# Patient Record
Sex: Female | Born: 1970 | Race: White | Hispanic: No | Marital: Married | Smoking: Former smoker
Health system: Southern US, Community
[De-identification: ages and names within clinical notes are randomized; demographics above are authoritative.]

## PROBLEM LIST (undated history)

## (undated) DIAGNOSIS — F329 Major depressive disorder, single episode, unspecified: Secondary | ICD-10-CM

## (undated) DIAGNOSIS — R011 Cardiac murmur, unspecified: Secondary | ICD-10-CM

## (undated) DIAGNOSIS — E039 Hypothyroidism, unspecified: Secondary | ICD-10-CM

## (undated) DIAGNOSIS — R87619 Unspecified abnormal cytological findings in specimens from cervix uteri: Secondary | ICD-10-CM

## (undated) DIAGNOSIS — F419 Anxiety disorder, unspecified: Secondary | ICD-10-CM

## (undated) DIAGNOSIS — F32A Depression, unspecified: Secondary | ICD-10-CM

## (undated) DIAGNOSIS — I10 Essential (primary) hypertension: Secondary | ICD-10-CM

## (undated) DIAGNOSIS — E669 Obesity, unspecified: Secondary | ICD-10-CM

## (undated) HISTORY — PX: DILATION AND CURETTAGE OF UTERUS: SHX78

## (undated) HISTORY — DX: Anxiety disorder, unspecified: F41.9

## (undated) HISTORY — DX: Hypothyroidism, unspecified: E03.9

## (undated) HISTORY — DX: Unspecified abnormal cytological findings in specimens from cervix uteri: R87.619

## (undated) HISTORY — DX: Major depressive disorder, single episode, unspecified: F32.9

## (undated) HISTORY — DX: Obesity, unspecified: E66.9

## (undated) HISTORY — DX: Essential (primary) hypertension: I10

## (undated) HISTORY — DX: Depression, unspecified: F32.A

## (undated) HISTORY — DX: Cardiac murmur, unspecified: R01.1

## (undated) HISTORY — PX: SALIVARY GLAND SURGERY: SHX768

## (undated) HISTORY — PX: COLPOSCOPY: SHX161

## (undated) HISTORY — PX: ENDOMETRIAL ABLATION: SHX621

---

## 1996-08-01 HISTORY — PX: TYMPANOSTOMY: SHX2586

## 2004-07-01 ENCOUNTER — Ambulatory Visit (HOSPITAL_COMMUNITY): Admission: RE | Admit: 2004-07-01 | Discharge: 2004-07-01 | Payer: Self-pay | Admitting: Obstetrics and Gynecology

## 2004-08-13 ENCOUNTER — Ambulatory Visit (HOSPITAL_COMMUNITY): Admission: RE | Admit: 2004-08-13 | Discharge: 2004-08-13 | Payer: Self-pay | Admitting: Obstetrics and Gynecology

## 2010-04-06 ENCOUNTER — Ambulatory Visit: Payer: Self-pay | Admitting: Emergency Medicine
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
## 2013-10-17 ENCOUNTER — Emergency Department (INDEPENDENT_AMBULATORY_CARE_PROVIDER_SITE_OTHER): Payer: BC Managed Care – PPO

## 2013-10-17 ENCOUNTER — Emergency Department
Admission: EM | Admit: 2013-10-17 | Discharge: 2013-10-17 | Disposition: A | Discharge status: Home / Self Care | Payer: BC Managed Care – PPO | Source: Home / Self Care | Attending: Emergency Medicine | Admitting: Emergency Medicine

## 2013-10-17 ENCOUNTER — Encounter: Payer: Self-pay | Admitting: Emergency Medicine

## 2013-10-17 DIAGNOSIS — S0083XA Contusion of other part of head, initial encounter: Secondary | ICD-10-CM

## 2013-10-17 DIAGNOSIS — S60219A Contusion of unspecified wrist, initial encounter: Secondary | ICD-10-CM

## 2013-10-17 DIAGNOSIS — S161XXA Strain of muscle, fascia and tendon at neck level, initial encounter: Secondary | ICD-10-CM

## 2013-10-17 DIAGNOSIS — S0003XA Contusion of scalp, initial encounter: Secondary | ICD-10-CM

## 2013-10-17 DIAGNOSIS — S139XXA Sprain of joints and ligaments of unspecified parts of neck, initial encounter: Secondary | ICD-10-CM

## 2013-10-17 DIAGNOSIS — S60212A Contusion of left wrist, initial encounter: Secondary | ICD-10-CM

## 2013-10-17 DIAGNOSIS — S1093XA Contusion of unspecified part of neck, initial encounter: Secondary | ICD-10-CM

## 2013-10-17 DIAGNOSIS — M25539 Pain in unspecified wrist: Secondary | ICD-10-CM

## 2013-10-17 DIAGNOSIS — R6884 Jaw pain: Secondary | ICD-10-CM

## 2013-10-17 MED ORDER — CYCLOBENZAPRINE HCL 5 MG PO TABS: ORAL_TABLET | ORAL | Status: DC

## 2013-10-17 MED ORDER — HYDROCODONE-ACETAMINOPHEN 5-325 MG PO TABS: 1.0000 | ORAL_TABLET | ORAL | Status: DC | PRN
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
## 2014-02-14 LAB — HM MAMMOGRAPHY

## 2014-02-26 ENCOUNTER — Encounter: Payer: Self-pay | Admitting: Physician Assistant

## 2014-02-26 ENCOUNTER — Ambulatory Visit (INDEPENDENT_AMBULATORY_CARE_PROVIDER_SITE_OTHER): Payer: BC Managed Care – PPO | Admitting: Physician Assistant

## 2014-02-26 ENCOUNTER — Other Ambulatory Visit: Payer: Self-pay | Admitting: *Deleted

## 2014-02-26 VITALS — BP 136/84 | HR 69 | Ht 62.0 in | Wt 193.0 lb

## 2014-02-26 DIAGNOSIS — E669 Obesity, unspecified: Secondary | ICD-10-CM

## 2014-02-26 DIAGNOSIS — E039 Hypothyroidism, unspecified: Secondary | ICD-10-CM

## 2014-02-26 DIAGNOSIS — N840 Polyp of corpus uteri: Secondary | ICD-10-CM | POA: Insufficient documentation

## 2014-02-26 DIAGNOSIS — N949 Unspecified condition associated with female genital organs and menstrual cycle: Secondary | ICD-10-CM

## 2014-02-26 DIAGNOSIS — N925 Other specified irregular menstruation: Secondary | ICD-10-CM | POA: Diagnosis not present

## 2014-02-26 DIAGNOSIS — Z13220 Encounter for screening for lipoid disorders: Secondary | ICD-10-CM

## 2014-02-26 DIAGNOSIS — N938 Other specified abnormal uterine and vaginal bleeding: Secondary | ICD-10-CM | POA: Insufficient documentation

## 2014-02-26 DIAGNOSIS — M76899 Other specified enthesopathies of unspecified lower limb, excluding foot: Secondary | ICD-10-CM

## 2014-02-26 DIAGNOSIS — M7061 Trochanteric bursitis, right hip: Secondary | ICD-10-CM | POA: Insufficient documentation

## 2014-02-26 DIAGNOSIS — Z131 Encounter for screening for diabetes mellitus: Secondary | ICD-10-CM

## 2014-02-26 HISTORY — DX: Hypothyroidism, unspecified: E03.9

## 2014-02-26 NOTE — Progress Notes (Signed)
   Subjective:    Patient ID: Julie Martin, female    DOB: 1970/09/08, 43 y.o.   MRN: [id number]  HPI Patient is a 43 year old female who presents to the clinic to establish care.  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 She's previously been managed by her primary care physician. Her last injection in her bursa was 1 year ago.  Patient recently went to OB/GYN for her annual appointment. She does have an up-to-date Pap smear of 2014. There was identified uterine polyps from dysfunctional uterine bleeding. She has appointment in August for ablation and D&C.  Patient has had a past history of underactive thyroid. She has not been on medication since the birth of her son 9 years ago. She certainly struggles with being obese and she works out 3-4 times a week along with counts her calories to about 1300 calories a day. She really would like to work on weight loss.  .. History   Social History  . Marital Status: Married    Spouse Name: N/A    Number of Children: N/A  . Years of Education: N/A   Occupational History  . Not on file.   Social History Main Topics  . Smoking status: Former Games developermoker  . Smokeless tobacco: Not on file  . Alcohol Use: Yes  . Drug Use: Not on file  . Sexual Activity: Not on file   Other Topics Concern  . Not on file   Social History Narrative  . No narrative on file       Review of Systems  All other systems reviewed and are negative.      Objective:   Physical Exam  Constitutional: She is oriented to person, place, and time. She appears well-developed and well-nourished.  HENT:  Head: Normocephalic and atraumatic.  Neck: Normal range of motion. Neck supple.  Cardiovascular: Normal rate, regular rhythm and normal heart sounds.   Pulmonary/Chest: Effort normal and breath sounds normal.  shoulder notching present bilaterally.   Lymphadenopathy:    She has no cervical adenopathy.  Neurological: She is  alert and oriented to person, place, and time.  Skin: Skin is dry.  Psychiatric: She has a normal mood and affect. Her behavior is normal.          Assessment & Plan:  Hypothyroidism-she has not had thyroid levels checked since birth of her child 9 years ago. Certainly with her weight struggle we will check today.   Greater trochanteric bursitis- discussed with pt exercises to help and possible PT. Pt doing well today. Last injection over one year.   Uterine polyp/DUB- managed by GYN. Surgery scheduled for 03/2014.  Bilateral large breast- discuss with patient that large breasts could be impeding on her exercise and ability to lose weight. Patient also complains of upper back tightness and pain from breath for the last couple of years. She really would like to lose weight on her own and see if her breast size decreases.  Obesity- certainly sounds like she is keeping to a good caloric intake a day and exercise regularly. Discussed stepping up exercise to more intensity and try interval training. Offered to start phentermine. Declined today. Will get in with nutritionist.   Fasting labs were given to have drawn. Follow up with [facility].

## 2014-02-26 NOTE — Patient Instructions (Signed)
Nutritionist

## 2014-03-17 ENCOUNTER — Encounter: Payer: BC Managed Care – PPO | Admitting: Physician Assistant

## 2014-03-20 LAB — COMPLETE METABOLIC PANEL WITH GFR
ALT: 9 U/L (ref 0–35)
AST: 12 U/L (ref 0–37)
Albumin: 4.2 g/dL (ref 3.5–5.2)
Alkaline Phosphatase: 65 U/L (ref 39–117)
BILIRUBIN TOTAL: 0.4 mg/dL (ref 0.2–1.2)
BUN: 13 mg/dL (ref 6–23)
CO2: 25 mEq/L (ref 19–32)
Calcium: 8.8 mg/dL (ref 8.4–10.5)
Chloride: 106 mEq/L (ref 96–112)
Creat: 0.68 mg/dL (ref 0.50–1.10)
GFR, Est African American: >89 mL/min
GLUCOSE: 85 mg/dL (ref 70–99)
Potassium: 4.4 mEq/L (ref 3.5–5.3)
Sodium: 139 mEq/L (ref 135–145)
Total Protein: 6.7 g/dL (ref 6.0–8.3)

## 2014-03-20 LAB — TSH: TSH: 1.641 u[IU]/mL (ref 0.350–4.500)

## 2014-03-20 LAB — LIPID PANEL
Cholesterol: 195 mg/dL (ref 0–200)
HDL: 51 mg/dL (ref >39)
LDL Cholesterol: 121 mg/dL — ABNORMAL HIGH (ref 0–99)
Total CHOL/HDL Ratio: 3.8 Ratio
Triglycerides: 114 mg/dL (ref <150)
VLDL: 23 mg/dL (ref 0–40)

## 2014-03-25 ENCOUNTER — Encounter: Payer: Self-pay | Admitting: Physician Assistant

## 2014-03-25 ENCOUNTER — Ambulatory Visit (INDEPENDENT_AMBULATORY_CARE_PROVIDER_SITE_OTHER): Payer: BC Managed Care – PPO | Admitting: Physician Assistant

## 2014-03-25 VITALS — BP 136/83 | HR 67 | Ht 62.5 in | Wt 191.0 lb

## 2014-03-25 DIAGNOSIS — Z0000 Encounter for general adult medical examination without abnormal findings: Secondary | ICD-10-CM

## 2014-03-25 DIAGNOSIS — F411 Generalized anxiety disorder: Secondary | ICD-10-CM

## 2014-03-25 DIAGNOSIS — F329 Major depressive disorder, single episode, unspecified: Secondary | ICD-10-CM

## 2014-03-25 DIAGNOSIS — F3289 Other specified depressive episodes: Secondary | ICD-10-CM | POA: Diagnosis not present

## 2014-03-25 DIAGNOSIS — F419 Anxiety disorder, unspecified: Secondary | ICD-10-CM

## 2014-03-25 DIAGNOSIS — F32A Depression, unspecified: Secondary | ICD-10-CM

## 2014-03-25 MED ORDER — BUPROPION HCL ER (XL) 150 MG PO TB24: 150.0000 mg | ORAL_TABLET | Freq: Every day | ORAL | Status: DC

## 2014-03-25 NOTE — Patient Instructions (Signed)
Keeping You Healthy  Get These Tests 1. Blood Pressure- Have your blood pressure checked once a year by your health care provider.  Normal blood pressure is 120/80. 2. Weight- Have your body mass index (BMI) calculated to screen for obesity.  BMI is measure of body fat based on height and weight.  You can also calculate your own BMI at www.nhlbisupport.com/bmi/. 3. Cholesterol- Have your cholesterol checked every 5 years starting at age 41 then yearly starting at age 45. 4. Chlamydia, HIV, and other sexually transmitted diseases- Get screened every year until age 25, then within three months of each new sexual provider. 5. Pap Smear- Every 1-3 years; discuss with your health care provider. 6. Mammogram- Every year starting at age 40  Take these medicines  Calcium with Vitamin D-Your body needs 1200 mg of Calcium each day and 800-1000 IU of Vitamin D daily.  Your body can only absorb 500 mg of Calcium at a time so Calcium must be taken in 2 or 3 divided doses throughout the day.  Multivitamin with folic acid- Once daily if it is possible for you to become pregnant.  Get these Immunizations  Tetanus shot- Every 10 years.  Flu shot-Every year.  Take these steps 1. Do not smoke-Your healthcare provider can help you quit.  For tips on how to quit go to www.smokefree.gov or call 1-800 QUITNOW. 2. Be physically active- Exercise 5 days a week for at least 30 minutes.  If you are not already physically active, start slow and gradually work up to 30 minutes of moderate physical activity.  Examples of moderate activity include walking briskly, dancing, swimming, bicycling, etc. 3. Breast Cancer- A self breast exam every month is important for early detection of breast cancer.  For more information and instruction on self breast exams, ask your healthcare provider or www.womenshealth.gov/faq/breast-self-exam.cfm. 4. Eat a healthy diet- Eat a variety of healthy foods such as fruits, vegetables, whole  grains, low fat milk, low fat cheeses, yogurt, lean meats, poultry and fish, beans, nuts, tofu, etc.  For more information go to www. Thenutritionsource.org 5. Drink alcohol in moderation- Limit alcohol intake to one drink or less per day. Never drink and drive. 6. Depression- Your emotional health is as important as your physical health.  If you're feeling down or losing interest in things you normally enjoy please talk to your healthcare provider about being screened for depression. 7. Dental visit- Brush and floss your teeth twice daily; visit your dentist twice a year. 8. Eye doctor- Get an eye exam at least every 2 years. 9. Helmet use- Always wear a helmet when riding a bicycle, motorcycle, rollerblading or skateboarding. 10. Safe sex- If you may be exposed to sexually transmitted infections, use a condom. 11. Seat belts- Seat belts can save your live; always wear one. 12. Smoke/Carbon Monoxide detectors- These detectors need to be installed on the appropriate level of your home. Replace batteries at least once a year. 13. Skin cancer- When out in the sun please cover up and use sunscreen 15 SPF or higher. 14. Violence- If anyone is threatening or hurting you, please tell your healthcare provider.        

## 2014-03-26 DIAGNOSIS — F329 Major depressive disorder, single episode, unspecified: Secondary | ICD-10-CM | POA: Insufficient documentation

## 2014-03-26 DIAGNOSIS — F419 Anxiety disorder, unspecified: Secondary | ICD-10-CM | POA: Insufficient documentation

## 2014-03-26 DIAGNOSIS — F32A Anxiety disorder, unspecified: Secondary | ICD-10-CM | POA: Insufficient documentation

## 2014-03-26 NOTE — Progress Notes (Signed)
  Subjective:     Julie Martin is a 43 y.o. female and is here for a comprehensive physical exam. The patient reports problems - she feels like she has tried to control anxiety symptoms for far too long. she had previously been on wellbutrin with good results. she denies any suicidal or homicidal thoughts. she would like to try it again. Marland Kitchen  History   Social History  . Marital Status: Married    Spouse Name: N/A    Number of Children: N/A  . Years of Education: N/A   Occupational History  . Not on file.   Social History Main Topics  . Smoking status: Former Games developer  . Smokeless tobacco: Not on file  . Alcohol Use: Yes  . Drug Use: Not on file  . Sexual Activity: Not on file   Other Topics Concern  . Not on file   Social History Narrative  . No narrative on file   Health Maintenance  Topic Date Due  . Influenza Vaccine  03/26/2015 (Originally 03/01/2014)  . Tetanus/tdap  08/01/2014  . Pap Smear  08/02/2015    The following portions of the patient's history were reviewed and updated as appropriate: allergies, current medications, past family history, past medical history, past social history, past surgical history and problem list.  Review of Systems A comprehensive review of systems was negative.   Objective:    BP 136/83  Pulse 67  Ht 5' 2.5" (1.588 m)  Wt 191 lb (86.637 kg)  BMI 34.36 kg/m2 General appearance: alert, cooperative and appears stated age Head: Normocephalic, without obvious abnormality, atraumatic Eyes: conjunctivae/corneas clear. PERRL, EOM's intact. Fundi benign. Ears: normal TM's and external ear canals both ears Nose: Nares normal. Septum midline. Mucosa normal. No drainage or sinus tenderness. Throat: lips, mucosa, and tongue normal; teeth and gums normal Neck: no adenopathy, no carotid bruit, no JVD, supple, symmetrical, trachea midline and thyroid not enlarged, symmetric, no tenderness/mass/nodules Back: symmetric, no curvature. ROM normal.  No CVA tenderness. Lungs: clear to auscultation bilaterally Heart: regular rate and rhythm, S1, S2 normal, no murmur, click, rub or gallop Abdomen: soft, non-tender; bowel sounds normal; no masses,  no organomegaly Extremities: extremities normal, atraumatic, no cyanosis or edema Pulses: 2+ and symmetric Skin: Skin color, texture, turgor normal. No rashes or lesions Lymph nodes: Cervical, supraclavicular, and axillary nodes normal. Neurologic: Grossly normal    Assessment:    Healthy female exam.      Plan:    CPE- pt declined flu shot. Pap and mammogram up to date. Labs reviewed today. LDL just a tad elevated. Discussed diet and exercise. Recheck in one year.   Anxiety/Depression- PHQ-9 was 13. GAD-7 was 16. I feel like starting wellbutrin back is a great idea this could help with focus/anxiety/depression/weight loss. Follow up in 4-6 weeks.   Obesity- Wellbutrin can certainly help with losing weight. Discussed changing up diet and increasing exercise.  See After Visit Summary for Counseling Recommendations

## 2014-06-23 ENCOUNTER — Encounter: Payer: Self-pay | Admitting: Physician Assistant

## 2014-06-23 ENCOUNTER — Ambulatory Visit (INDEPENDENT_AMBULATORY_CARE_PROVIDER_SITE_OTHER): Payer: BC Managed Care – PPO | Admitting: Physician Assistant

## 2014-06-23 VITALS — BP 148/89 | HR 66 | Ht 62.5 in | Wt 186.0 lb

## 2014-06-23 DIAGNOSIS — F419 Anxiety disorder, unspecified: Secondary | ICD-10-CM | POA: Diagnosis not present

## 2014-06-23 DIAGNOSIS — F32A Depression, unspecified: Secondary | ICD-10-CM

## 2014-06-23 DIAGNOSIS — R030 Elevated blood-pressure reading, without diagnosis of hypertension: Secondary | ICD-10-CM | POA: Diagnosis not present

## 2014-06-23 DIAGNOSIS — F329 Major depressive disorder, single episode, unspecified: Secondary | ICD-10-CM | POA: Diagnosis not present

## 2014-06-23 DIAGNOSIS — IMO0001 Reserved for inherently not codable concepts without codable children: Secondary | ICD-10-CM

## 2014-06-23 MED ORDER — BUPROPION HCL ER (XL) 300 MG PO TB24: 300.0000 mg | ORAL_TABLET | Freq: Every day | ORAL | Status: DC

## 2014-06-23 NOTE — Progress Notes (Signed)
   Subjective:    Patient ID: Samule OhmDebra L Tullos, female    DOB: March 21, 1971, 43 y.o.   MRN: [id number]  HPI Pt presents to the clinic for 3 m onth follow up for anxiety and depression. Doing well and has noticed improvement. No side effects. She does seem to be able to handle stressful situations better. Interested in increase.   Elevated BP- no hx of HTN. No CP, palpitations, headaches.    Review of Systems  All other systems reviewed and are negative.      Objective:   Physical Exam  Constitutional: She is oriented to person, place, and time. She appears well-developed and well-nourished.  HENT:  Head: Normocephalic and atraumatic.  Cardiovascular: Normal rate, regular rhythm and normal heart sounds.   Pulmonary/Chest: Effort normal and breath sounds normal.  Neurological: She is alert and oriented to person, place, and time.  Psychiatric: She has a normal mood and affect. Her behavior is normal.          Assessment & Plan:  Anxiety/depression- GAD-7 was 8. PHQ-9 was 5. Doing well but still feels anxiety at work. Increased to 300mg  daily. Follow up in 6 months.   Elevated blood pressure- first time in HTN range. Rechecked and same elevation. Discussed DASH diet.

## 2015-01-26 ENCOUNTER — Other Ambulatory Visit: Payer: Self-pay | Admitting: *Deleted

## 2015-01-26 MED ORDER — BUPROPION HCL ER (XL) 300 MG PO TB24: 300.0000 mg | ORAL_TABLET | Freq: Every day | ORAL | Status: DC

## 2015-04-02 LAB — HM PAP SMEAR

## 2015-04-09 ENCOUNTER — Telehealth: Payer: Self-pay | Admitting: Emergency Medicine

## 2015-06-12 ENCOUNTER — Encounter: Payer: Self-pay | Admitting: Physician Assistant

## 2015-06-12 ENCOUNTER — Ambulatory Visit (INDEPENDENT_AMBULATORY_CARE_PROVIDER_SITE_OTHER): Payer: BC Managed Care – PPO | Admitting: Physician Assistant

## 2015-06-12 VITALS — BP 140/88 | HR 72 | Ht 62.5 in | Wt 200.0 lb

## 2015-06-12 DIAGNOSIS — IMO0001 Reserved for inherently not codable concepts without codable children: Secondary | ICD-10-CM | POA: Insufficient documentation

## 2015-06-12 DIAGNOSIS — F419 Anxiety disorder, unspecified: Secondary | ICD-10-CM | POA: Diagnosis not present

## 2015-06-12 DIAGNOSIS — M533 Sacrococcygeal disorders, not elsewhere classified: Secondary | ICD-10-CM | POA: Diagnosis not present

## 2015-06-12 DIAGNOSIS — R030 Elevated blood-pressure reading, without diagnosis of hypertension: Secondary | ICD-10-CM

## 2015-06-12 DIAGNOSIS — F329 Major depressive disorder, single episode, unspecified: Secondary | ICD-10-CM

## 2015-06-12 DIAGNOSIS — F32A Depression, unspecified: Secondary | ICD-10-CM

## 2015-06-12 MED ORDER — DICLOFENAC SODIUM 2 % TD SOLN: TRANSDERMAL | Status: DC

## 2015-06-12 MED ORDER — BUPROPION HCL ER (XL) 300 MG PO TB24: 300.0000 mg | ORAL_TABLET | Freq: Every day | ORAL | Status: DC

## 2015-06-12 NOTE — Progress Notes (Signed)
   Subjective:    Patient ID: Julie Martin, female    DOB: [birth year], 44 y.o.   MRN: [id number]  HPI  Patient is a 44 year old female who presents to the clinic for anxiety and depression medication refill. She states she is slightly more stress than usual. She feels like Wellbutrin is helping significantly. She does not wish to change or increase her medications. She denies any suicidal or homicidal thoughts. She has some breaks in school coming up that will help with her stress. It is all work-related.  Elevated blood pressure- we'll recheck today. Has not had a history of blood pressure problems. She denies any chest pain, palpitations, dizziness, headaches or tachycardia.  Patient does complain of some lower left sided back pain. This is been ongoing for approximately year. The pain is not every day just off and on. She has days with this is worse. She denies any known injury to her lower back. Tends to be worse when she is sitting and better when she is standing. she denies any radiation of pain or numbness and tingling down her legs. She's not tried anything to make better.    Review of Systems See HPI.     Objective:   Physical Exam  Constitutional: She is oriented to person, place, and time. She appears well-developed and well-nourished.  HENT:  Head: Normocephalic and atraumatic.  Cardiovascular: Normal rate, regular rhythm and normal heart sounds.   Pulmonary/Chest: Effort normal and breath sounds normal. She has no wheezes.  Musculoskeletal:  ROM at waist normal.  Negative bilateral straight leg test.  No pain to palpation over lumbar spine.  Pain over SI of left.  No pain over greater trochanter bilateral leg.   Neurological: She is alert and oriented to person, place, and time.  Skin: Skin is dry.  Psychiatric: She has a normal mood and affect. Her behavior is normal.          Assessment & Plan:  Anxiety/depression- GAD-7 was 9. PHQ-9 was 7. Patient is undergoing  a lot of situational stress with school and testing right now. Does not want to change her medications. Medications were refilled for one year.  SI joint dysfunction, left- pennsaid cream was ordered to use twice a day on area of discomfort. When pain worsens can use oral NSAIDs. Patient was given exercises and stretches to start daily. Follow-up as needed or if symptoms worsening. Next up would be to get a lumbar x-ray.  Elevated Blood pressure- recheck and BP decreased and borderline. Encouraged weight loss and low-salt diet.Exercise could also help her blood pressure. Encouraged patient to recheck blood pressure at local pharmacies on and off and make sure under 140/90. Follow-up. Office in 6 months.  Patient's last lab and physical was 03/16/15. At next visit she was encouraged to schedule a complete physical.

## 2015-10-08 ENCOUNTER — Telehealth: Payer: Self-pay | Admitting: *Deleted

## 2015-10-08 NOTE — Telephone Encounter (Signed)
Pt called back & was notified of instructions.

## 2015-10-08 NOTE — Telephone Encounter (Signed)
OK to restart. Will need to start with 150XL for 10 days and then increase to 300mg  XL.

## 2015-10-08 NOTE — Telephone Encounter (Signed)
LMOM for pt to return call. 

## 2015-10-08 NOTE — Telephone Encounter (Signed)
Pt left a vm stating that she stopped her 300mg  Wellbutrin 2 weeks ago and wants to restart it again now.  She wants to know if it's ok to start back at the 300mg  or if she needs to taper up.  Please advise.

## 2015-10-09 MED ORDER — BUPROPION HCL ER (XL) 150 MG PO TB24: 150.0000 mg | ORAL_TABLET | Freq: Every day | ORAL | Status: DC

## 2015-10-09 NOTE — Telephone Encounter (Signed)
Ok, new rx sent for 150mg .

## 2015-10-09 NOTE — Telephone Encounter (Addendum)
Correction. Stanton KidneyDebra states she had paranoid and suicidal thoughts while taking the 300 mg Wellbutrin. She would like to go back on the 150 mg tablets. [facility] [place] [facility] [place]

## 2015-10-09 NOTE — Telephone Encounter (Signed)
Patient advised.

## 2015-10-09 NOTE — Addendum Note (Signed)
Addended by: Nani GasserMETHENEY, Vinaya Sancho D on: 10/09/2015 01:01 PM   Modules accepted: Orders

## 2016-01-04 ENCOUNTER — Other Ambulatory Visit: Payer: Self-pay | Admitting: Family Medicine

## 2016-01-05 ENCOUNTER — Ambulatory Visit: Payer: BC Managed Care – PPO | Admitting: Physician Assistant

## 2016-01-15 ENCOUNTER — Telehealth: Payer: Self-pay | Admitting: *Deleted

## 2016-01-15 DIAGNOSIS — Z0000 Encounter for general adult medical examination without abnormal findings: Secondary | ICD-10-CM

## 2016-01-15 DIAGNOSIS — Z13220 Encounter for screening for lipoid disorders: Secondary | ICD-10-CM

## 2016-01-15 NOTE — Telephone Encounter (Signed)
Fasting labs ordered

## 2016-01-18 ENCOUNTER — Encounter: Payer: Self-pay | Admitting: Physician Assistant

## 2016-01-18 ENCOUNTER — Ambulatory Visit (INDEPENDENT_AMBULATORY_CARE_PROVIDER_SITE_OTHER): Payer: BC Managed Care – PPO | Admitting: Physician Assistant

## 2016-01-18 VITALS — BP 139/88 | HR 75 | Ht 62.5 in | Wt 186.0 lb

## 2016-01-18 DIAGNOSIS — Z0000 Encounter for general adult medical examination without abnormal findings: Secondary | ICD-10-CM | POA: Diagnosis not present

## 2016-01-18 DIAGNOSIS — IMO0001 Reserved for inherently not codable concepts without codable children: Secondary | ICD-10-CM

## 2016-01-18 DIAGNOSIS — R030 Elevated blood-pressure reading, without diagnosis of hypertension: Secondary | ICD-10-CM

## 2016-01-18 DIAGNOSIS — E669 Obesity, unspecified: Secondary | ICD-10-CM | POA: Insufficient documentation

## 2016-01-18 NOTE — Patient Instructions (Signed)

## 2016-01-18 NOTE — Progress Notes (Addendum)
   Subjective:    Patient ID: Julie Martin, female    DOB: Feb 28, 1971, 45 y.o.   MRN: [id number]  HPI   Review of Systems     Objective:   Physical Exam        Assessment & Plan:   Subjective:     Julie Martin is a 45 y.o. female and is here for a comprehensive physical exam. The patient reports no problems.  Social History   Social History  . Marital Status: Married    Spouse Name: N/A  . Number of Children: N/A  . Years of Education: N/A   Occupational History  . Not on file.   Social History Main Topics  . Smoking status: Former Games developermoker  . Smokeless tobacco: Not on file  . Alcohol Use: Yes  . Drug Use: Not on file  . Sexual Activity: Not on file   Other Topics Concern  . Not on file   Social History Narrative   Health Maintenance  Topic Date Due  . HIV Screening  01/18/1988  . MAMMOGRAM  01/18/1991  . PAP SMEAR  08/02/2015  . INFLUENZA VACCINE  06/11/2017 (Originally 03/01/2016)  . TETANUS/TDAP  09/18/2019    The following portions of the patient's history were reviewed and updated as appropriate: allergies, current medications, past family history, past medical history, past social history, past surgical history and problem list.  Review of Systems Pertinent items noted in HPI and remainder of comprehensive ROS otherwise negative.   Objective:    BP 139/88 mmHg  Pulse 75  Ht 5' 2.5" (1.588 m)  Wt 186 lb (84.369 kg)  BMI 33.46 kg/m2 General appearance: alert, cooperative, appears stated age and moderately obese Head: Normocephalic, without obvious abnormality, atraumatic Eyes: conjunctivae/corneas clear. PERRL, EOM's intact. Fundi benign. Ears: normal TM's and external ear canals both ears Nose: Nares normal. Septum midline. Mucosa normal. No drainage or sinus tenderness. Throat: lips, mucosa, and tongue normal; teeth and gums normal Neck: no adenopathy, no carotid bruit, no JVD, supple, symmetrical, trachea midline and thyroid not enlarged,  symmetric, no tenderness/mass/nodules Back: symmetric, no curvature. ROM normal. No CVA tenderness. Lungs: clear to auscultation bilaterally Heart: regular rate and rhythm, S1, S2 normal, no murmur, click, rub or gallop Abdomen: soft, non-tender; bowel sounds normal; no masses,  no organomegaly Extremities: extremities normal, atraumatic, no cyanosis or edema Pulses: 2+ and symmetric Skin: Skin color, texture, turgor normal. No rashes or lesions Lymph nodes: Cervical, supraclavicular, and axillary nodes normal. Neurologic: Alert and oriented X 3, normal strength and tone. Normal symmetric reflexes. Normal coordination and gait    Assessment:    Healthy female exam.      Plan:     Routine physical-Pap and mamm[place] done [place] [place] [facility] [place]. We are calling to get her records. She had fasting labs done this morning. We will call her with results. Encouraged vitamin D 800 units and calcium 1500 mg or 4 servings of dairy a day.  Obesity-continue to work with Dr. Zachary GeorgeLori Cho in the weight loss and to maximize weight loss. Lost 25lbs so far.   Elevated blood pressure-certainly phentermine could be causing a minor elevation. She reports normal blood pressures at weight loss clinic. Discussed the DASH diet and decreasing sodium. Had a discussion with patient about the effects of long-term elevated blood pressure. She will continue to monitor and if ranging above 140/90 she will follow-up in office. See After Visit Summary for Counseling Recommendations

## 2016-01-19 LAB — COMPLETE METABOLIC PANEL WITH GFR
ALT: 12 U/L (ref 6–29)
AST: 14 U/L (ref 10–30)
Albumin: 3.9 g/dL (ref 3.6–5.1)
Alkaline Phosphatase: 66 U/L (ref 33–115)
BUN: 18 mg/dL (ref 7–25)
CO2: 25 mmol/L (ref 20–31)
Calcium: 8.8 mg/dL (ref 8.6–10.2)
Chloride: 106 mmol/L (ref 98–110)
Creat: 0.84 mg/dL (ref 0.50–1.10)
GFR, EST NON AFRICAN AMERICAN: 85 mL/min (ref >=60)
GFR, Est African American: >89 mL/min (ref >=60)
Glucose, Bld: 79 mg/dL (ref 65–99)
Potassium: 4.6 mmol/L (ref 3.5–5.3)
Sodium: 139 mmol/L (ref 135–146)
Total Bilirubin: 0.3 mg/dL (ref 0.2–1.2)
Total Protein: 6.7 g/dL (ref 6.1–8.1)

## 2016-01-19 LAB — LIPID PANEL
Cholesterol: 160 mg/dL (ref 125–200)
HDL: 53 mg/dL (ref >=46)
LDL Cholesterol: 96 mg/dL (ref <130)
Total CHOL/HDL Ratio: 3 Ratio (ref <=5.0)
Triglycerides: 57 mg/dL (ref <150)
VLDL: 11 mg/dL (ref <30)

## 2016-01-21 ENCOUNTER — Encounter: Payer: Self-pay | Admitting: Physician Assistant

## 2016-02-08 ENCOUNTER — Other Ambulatory Visit: Payer: Self-pay | Admitting: Physician Assistant

## 2016-03-16 ENCOUNTER — Encounter: Payer: Self-pay | Admitting: Physician Assistant

## 2016-03-16 ENCOUNTER — Telehealth: Payer: Self-pay | Admitting: *Deleted

## 2016-03-16 ENCOUNTER — Ambulatory Visit (INDEPENDENT_AMBULATORY_CARE_PROVIDER_SITE_OTHER): Payer: BC Managed Care – PPO | Admitting: Physician Assistant

## 2016-03-16 VITALS — BP 150/86 | HR 84 | Ht 62.5 in | Wt 184.0 lb

## 2016-03-16 DIAGNOSIS — E669 Obesity, unspecified: Secondary | ICD-10-CM | POA: Diagnosis not present

## 2016-03-16 DIAGNOSIS — I10 Essential (primary) hypertension: Secondary | ICD-10-CM

## 2016-03-16 HISTORY — DX: Essential (primary) hypertension: I10

## 2016-03-16 MED ORDER — LISINOPRIL-HYDROCHLOROTHIAZIDE 10-12.5 MG PO TABS: 1.0000 | ORAL_TABLET | Freq: Every day | ORAL | 2 refills | Status: DC

## 2016-03-16 NOTE — Patient Instructions (Addendum)
DASH Eating Plan DASH stands for "Dietary Approaches to Stop Hypertension." The DASH eating plan is a healthy eating plan that has been shown to reduce high blood pressure (hypertension). Additional health benefits may include reducing the risk of type 2 diabetes mellitus, heart disease, and stroke. The DASH eating plan may also help with weight loss. WHAT DO I NEED TO KNOW ABOUT THE DASH EATING PLAN? For the DASH eating plan, you will follow these general guidelines:  Choose foods with a percent daily value for sodium of less than 5% (as listed on the food label).  Use salt-free seasonings or herbs instead of table salt or sea salt.  Check with your health care provider or pharmacist before using salt substitutes.  Eat lower-sodium products, often labeled as "lower sodium" or "no salt added."  Eat fresh foods.  Eat more vegetables, fruits, and low-fat dairy products.  Choose whole grains. Look for the word "whole" as the first word in the ingredient list.  Choose fish and skinless chicken or turkey more often than red meat. Limit fish, poultry, and meat to 6 oz (170 g) each day.  Limit sweets, desserts, sugars, and sugary drinks.  Choose heart-healthy fats.  Limit cheese to 1 oz (28 g) per day.  Eat more home-cooked food and less restaurant, buffet, and fast food.  Limit fried foods.  Cook foods using methods other than frying.  Limit canned vegetables. If you do use them, rinse them well to decrease the sodium.  When eating at a restaurant, ask that your food be prepared with less salt, or no salt if possible. WHAT FOODS CAN I EAT? Seek help from a dietitian for individual calorie needs. Grains Whole grain or whole wheat bread. Brown rice. Whole grain or whole wheat pasta. Quinoa, bulgur, and whole grain cereals. Low-sodium cereals. Corn or whole wheat flour tortillas. Whole grain cornbread. Whole grain crackers. Low-sodium crackers. Vegetables Fresh or frozen vegetables  (raw, steamed, roasted, or grilled). Low-sodium or reduced-sodium tomato and vegetable juices. Low-sodium or reduced-sodium tomato sauce and paste. Low-sodium or reduced-sodium canned vegetables.  Fruits All fresh, canned (in natural juice), or frozen fruits. Meat and Other Protein Products Ground beef (85% or leaner), grass-fed beef, or beef trimmed of fat. Skinless chicken or turkey. Ground chicken or turkey. Pork trimmed of fat. All fish and seafood. Eggs. Dried beans, peas, or lentils. Unsalted nuts and seeds. Unsalted canned beans. Dairy Low-fat dairy products, such as skim or 1% milk, 2% or reduced-fat cheeses, low-fat ricotta or cottage cheese, or plain low-fat yogurt. Low-sodium or reduced-sodium cheeses. Fats and Oils Tub margarines without trans fats. Light or reduced-fat mayonnaise and salad dressings (reduced sodium). Avocado. Safflower, olive, or canola oils. Natural peanut or almond butter. Other Unsalted popcorn and pretzels. The items listed above may not be a complete list of recommended foods or beverages. Contact your dietitian for more options. WHAT FOODS ARE NOT RECOMMENDED? Grains White bread. White pasta. White rice. Refined cornbread. Bagels and croissants. Crackers that contain trans fat. Vegetables Creamed or fried vegetables. Vegetables in a cheese sauce. Regular canned vegetables. Regular canned tomato sauce and paste. Regular tomato and vegetable juices. Fruits Dried fruits. Canned fruit in light or heavy syrup. Fruit juice. Meat and Other Protein Products Fatty cuts of meat. Ribs, chicken wings, bacon, sausage, bologna, salami, chitterlings, fatback, hot dogs, bratwurst, and packaged luncheon meats. Salted nuts and seeds. Canned beans with salt. Dairy Whole or 2% milk, cream, half-and-half, and cream cheese. Whole-fat or sweetened yogurt. Full-fat   cheeses or blue cheese. Nondairy creamers and whipped toppings. Processed cheese, cheese spreads, or cheese  curds. Condiments Onion and garlic salt, seasoned salt, table salt, and sea salt. Canned and packaged gravies. Worcestershire sauce. Tartar sauce. Barbecue sauce. Teriyaki sauce. Soy sauce, including reduced sodium. Steak sauce. Fish sauce. Oyster sauce. Cocktail sauce. Horseradish. Ketchup and mustard. Meat flavorings and tenderizers. Bouillon cubes. Hot sauce. Tabasco sauce. Marinades. Taco seasonings. Relishes. Fats and Oils Butter, stick margarine, lard, shortening, ghee, and bacon fat. Coconut, palm kernel, or palm oils. Regular salad dressings. Other Pickles and olives. Salted popcorn and pretzels. The items listed above may not be a complete list of foods and beverages to avoid. Contact your dietitian for more information. WHERE CAN I FIND MORE INFORMATION? National Heart, Lung, and Blood Institute: CableProm[employer].itwww.nhlbi.nih.gov/health/health-topics/topics/dash/   This information is not intended to replace advice given to you by your health care provider. Make sure you discuss any questions you have with your health care provider.   Document Released: 07/07/2011 Document Revised: 08/08/2014 Document Reviewed: 05/22/2013 Elsevier Interactive Patient Education 2016 [employer]   saxenda/belviq

## 2016-03-16 NOTE — Telephone Encounter (Signed)
Pt left vm stating that her ins company said they cover both Saxenda & Belviq with prior authorization.  She wanted to know which one you would suggest she start.  Please advise.

## 2016-03-16 NOTE — Progress Notes (Signed)
   Subjective:    Patient ID: Julie Martin, female    DOB: October 06, 1970, 45 y.o.   MRN: [id number]  HPI  Patient is a 45 year old female who presents to the clinic to follow-up on blood pressure. She's been checking her blood pressure at the weight loss clinic. His been running 145-155/85-100. She denies any chest pains, palpitations, headaches or dizziness. She's never been on medication for blood pressure. She does have a strong family history of hypertension. Her weight loss clinic did stop her phentermine to see if the blood pressure went down. Her blood pressure stayed the same. She is currently on one half tablet of phentermine. She has lost 25 pounds total but her total oh weight loss is 50.    Review of Systems    see HPI.  Objective:   Physical Exam  Constitutional: She is oriented to person, place, and time. She appears well-developed and well-nourished.  HENT:  Head: Normocephalic and atraumatic.  Cardiovascular: Normal rate, regular rhythm and normal heart sounds.   Pulmonary/Chest: Effort normal and breath sounds normal.  Neurological: She is alert and oriented to person, place, and time.  Psychiatric: She has a normal mood and affect. Her behavior is normal.          Assessment & Plan:  HTN- has been steadily increasing. Started lisinopril/HCTZ. Discussed side effects. Follow up in 2 months. Continue to monitor BP needs to be under 140/90.   Obesity- see dr. Marchia Bondoe for weight management. Consider other options other than phentermine. Discussed saxenda and belviq. She will check with insurance and see if they pay for either.

## 2016-03-17 ENCOUNTER — Other Ambulatory Visit: Payer: Self-pay | Admitting: *Deleted

## 2016-03-17 MED ORDER — LIRAGLUTIDE -WEIGHT MANAGEMENT 18 MG/3ML ~~LOC~~ SOPN: 0.6000 mg | PEN_INJECTOR | Freq: Every day | SUBCUTANEOUS | 2 refills | Status: DC

## 2016-03-17 NOTE — Telephone Encounter (Signed)
Pt okay with trying Saxenda.  Rx sent to pharm.

## 2016-03-17 NOTE — Progress Notes (Unsigned)
Saxenda sent to pharm

## 2016-03-17 NOTE — Telephone Encounter (Signed)
If ok with injectable daily then send over saxenda .6mg  daily and increase by .6mg  weekly until at 3mg  daily. Please send pens. Coupon card online.

## 2016-03-18 ENCOUNTER — Telehealth: Payer: Self-pay | Admitting: *Deleted

## 2016-03-18 NOTE — Telephone Encounter (Signed)
PA initiated through covermymeds.  [id number] - PA Case ID: [id number]

## 2016-03-21 NOTE — Telephone Encounter (Signed)
saxenda has been approved through insurance. From 03/18/2016 through 07/18/2016.  Patient notified

## 2016-04-03 ENCOUNTER — Encounter: Payer: Self-pay | Admitting: Emergency Medicine

## 2016-04-03 ENCOUNTER — Emergency Department
Admission: EM | Admit: 2016-04-03 | Discharge: 2016-04-03 | Disposition: A | Discharge status: Home / Self Care | Payer: BC Managed Care – PPO | Source: Home / Self Care | Attending: Family Medicine | Admitting: Family Medicine

## 2016-04-03 DIAGNOSIS — S0083XA Contusion of other part of head, initial encounter: Secondary | ICD-10-CM

## 2016-04-03 DIAGNOSIS — S0990XA Unspecified injury of head, initial encounter: Secondary | ICD-10-CM

## 2016-04-03 NOTE — ED Provider Notes (Signed)
[facility]    CSN: [id number] Arrival date & time: 04/03/16  1740  First Provider Contact:  First MD Initiated Contact with Patient 04/03/16 1844        History   Chief Complaint Chief Complaint  Patient presents with  . Head Injury    HPI Julie Martin is a 45 y.o. female.   About two hours ago patient collided with her 40 pound dog, resulting in a large bump on her forehead.  No loss of consciousness.  She initially had nausea without vomiting, and now has a mild headache.  No localizing neurologic symptoms.   The history is provided by the patient.  Head Injury  Location:  Frontal Time since incident:  2 hours Mechanism of injury: direct blow   Pain details:    Quality:  Aching   Severity:  Mild   Duration:  2 hours   Timing:  Constant   Progression:  Unchanged Chronicity:  New Relieved by:  None tried Worsened by:  Pressure Ineffective treatments:  None tried Associated symptoms: headache and nausea   Associated symptoms: no blurred vision, no disorientation, no double vision, no focal weakness, no hearing loss, no loss of consciousness, no memory loss, no neck pain, no numbness, no seizures, no tinnitus and no vomiting     History reviewed. No pertinent past medical history.  Patient Active Problem List   Diagnosis Date Noted  . Essential hypertension, benign 03/16/2016  . Obesity 01/18/2016  . Elevated blood pressure 06/12/2015  . 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 Depression 03/26/2014  . Anxiety 03/26/2014  . Uterine polyp 02/26/2014  . DUB (dysfunctional uterine bleeding) 02/26/2014  . Greater trochanteric bursitis of right hip 02/26/2014  . Unspecified hypothyroidism 02/26/2014    History reviewed. No pertinent surgical history.  OB History    No data available       Home Medications    Prior to Admission medications   Medication Sig Start Date End Date Taking? Authorizing Provider  buPROPion  (WELLBUTRIN XL) 150 MG 24 hr tablet TAKE 1 TABLET (150 MG TOTAL) BY MOUTH DAILY. 02/09/16   Jade L Breeback, PA-C  CALCIUM PO Take by mouth.    Historical Provider, MD  Diclofenac Sodium 2 % SOLN 2 application twice a day. 06/12/15   Jade L Breeback, PA-C  Liraglutide -Weight Management (SAXENDA) 18 MG/3ML SOPN Inject 0.6 mg into the skin daily. Increase by .6mg  weekly until at 3mg  daily. Please include pen needles. 03/17/16   Jade L Breeback, PA-C  lisinopril-hydrochlorothiazide (PRINZIDE,ZESTORETIC) 10-12.5 MG tablet Take 1 tablet by mouth daily. 03/16/16   Jade L Breeback, PA-C  Multiple Vitamin (MULTIVITAMIN) tablet Take 1 tablet by mouth daily.    Historical Provider, MD  Omega-3 Fatty Acids (OMEGA 3 PO) Take by mouth.    Historical Provider, MD  phentermine 37.5 MG capsule Take 37.5 mg by mouth every morning.    Historical Provider, MD    Family History No family history on file.  Social History Social History  Substance Use Topics  . Smoking status: Former Games developer  . Smokeless tobacco: Never Used  . Alcohol use Yes     Allergies   Betadine [povidone iodine]; Doxycycline; Sulfonamide derivatives; and Sulfa antibiotics   Review of Systems Review of Systems  HENT: Negative for hearing loss and tinnitus.   Eyes: Negative for blurred vision and double vision.  Gastrointestinal: Positive for nausea. Negative for vomiting.  Musculoskeletal: Negative for neck pain.  Neurological: Positive for headaches. Negative for focal weakness, seizures, loss of consciousness and numbness.  Psychiatric/Behavioral: Negative for memory loss.  All other systems reviewed and are negative.    Physical Exam Triage Vital Signs ED Triage Vitals  Enc Vitals Group     BP 04/03/16 1801 (!) 166/117     Pulse Rate 04/03/16 1801 69     Resp --      Temp 04/03/16 1801 98.1 F (36.7 C)     Temp Source 04/03/16 1801 Oral     SpO2 04/03/16 1801 100 %     Weight 04/03/16 1802 179 lb 8 oz (81.4 kg)      Height 04/03/16 1802 5\' 2"  (1.575 m)     Head Circumference --      Peak Flow --      Pain Score 04/03/16 1803 8     Pain Loc --      Pain Edu? --      Excl. in GC? --    No data found.   Updated Vital Signs BP (!) 166/117 (BP Location: Left Arm)   Pulse 69   Temp 98.1 F (36.7 C) (Oral)   Ht 5\' 2"  (1.575 m)   Wt 179 lb 8 oz (81.4 kg)   SpO2 100%   BMI 32.83 kg/m   Visual Acuity Right Eye Distance:   Left Eye Distance:   Bilateral Distance:    Right Eye Near:   Left Eye Near:    Bilateral Near:     Physical Exam  Constitutional: She is oriented to person, place, and time. She appears well-developed and well-nourished. No distress.  HENT:  Head: Head is with contusion. Head is without raccoon's eyes and without Battle's sign.    Right Ear: Tympanic membrane, external ear and ear canal normal.  Left Ear: Tympanic membrane, external ear and ear canal normal.  Nose: Nose normal.  Mouth/Throat: Oropharynx is clear and moist.  Left forehead has hematoma as noted on diagram.  The area is mildly tender to palpation without evidence of bony stepoff  Eyes: Conjunctivae, EOM and lids are normal. Pupils are equal, round, and reactive to light.  No photophobia.  Fundi benign.  Neck: Normal range of motion.  Cardiovascular: Normal heart sounds.   Pulmonary/Chest: Breath sounds normal.  Abdominal: There is no tenderness.  Neurological: She is alert and oriented to person, place, and time. She has normal reflexes. No cranial nerve deficit. Coordination normal.  Skin: Skin is warm and dry.  Psychiatric: She has a normal mood and affect. Her behavior is normal.  Nursing note and vitals reviewed.    UC Treatments / Results  Labs (all labs ordered are listed, but only abnormal results are displayed) Labs Reviewed - No data to display  EKG  EKG Interpretation None       Radiology No results found.  Procedures Procedures (including critical care time)  Medications  Ordered in UC Medications - No data to display   Initial Impression / Assessment and Plan / UC Course  I have reviewed the triage vital signs and the nursing notes.  Pertinent labs & imaging results that were available during my care of the patient were reviewed by me and considered in my medical decision making (see chart for details).  Clinical Course  Apply ice pack for 15 to 20 minutes, 3 to 4 times daily  Continue until pain and swelling decrease.  May take Tylenol for pain. If symptoms become significantly worse during the  night or over the weekend, proceed to the local emergency room.   Discussed head injury precautions.     Final Clinical Impressions(s) / UC Diagnoses   Final diagnoses:  Contusion of forehead, initial encounter  Head injury due to trauma, initial encounter    New Prescriptions New Prescriptions   No medications on file     Lattie HawStephen A Darnesha Diloreto, MD 04/12/16 1946

## 2016-04-03 NOTE — Discharge Instructions (Signed)
Apply ice pack for 15 to 20 minutes, 3 to 4 times daily  Continue until pain and swelling decrease.  May take Tylenol for pain. If symptoms become significantly worse during the night or over the weekend, proceed to the local emergency room.

## 2016-04-03 NOTE — ED Triage Notes (Signed)
Pt collided with her 40lb dog and has a bump over the right side of her eye.  No loss of conscioness.

## 2016-04-05 ENCOUNTER — Ambulatory Visit (INDEPENDENT_AMBULATORY_CARE_PROVIDER_SITE_OTHER): Payer: BC Managed Care – PPO | Admitting: Physician Assistant

## 2016-04-05 ENCOUNTER — Encounter: Payer: Self-pay | Admitting: Physician Assistant

## 2016-04-05 VITALS — BP 141/89 | HR 71 | Ht 62.0 in | Wt 179.0 lb

## 2016-04-05 DIAGNOSIS — S060X0D Concussion without loss of consciousness, subsequent encounter: Secondary | ICD-10-CM | POA: Diagnosis not present

## 2016-04-05 DIAGNOSIS — S060X0A Concussion without loss of consciousness, initial encounter: Secondary | ICD-10-CM | POA: Insufficient documentation

## 2016-04-05 NOTE — Patient Instructions (Signed)
Concussion, Adult  A concussion, or closed-head injury, is a brain injury caused by a direct blow to the head or by a quick and sudden movement (jolt) of the head or neck. Concussions are usually not life-threatening. Even so, the effects of a concussion can be serious. If you have had a concussion before, you are more likely to experience concussion-like symptoms after a direct blow to the head.   CAUSES  · Direct blow to the head, such as from running into another player during a soccer game, being hit in a fight, or hitting your head on a hard surface.  · A jolt of the head or neck that causes the brain to move back and forth inside the skull, such as in a car crash.  SIGNS AND SYMPTOMS  The signs of a concussion can be hard to notice. Early on, they may be missed by you, family members, and health care providers. You may look fine but act or feel differently.  Symptoms are usually temporary, but they may last for days, weeks, or even longer. Some symptoms may appear right away while others may not show up for hours or days. Every head injury is different. Symptoms include:  · Mild to moderate headaches that will not go away.  · A feeling of pressure inside your head.  · Having more trouble than usual:    Learning or remembering things you have heard.    Answering questions.    Paying attention or concentrating.    Organizing daily tasks.    Making decisions and solving problems.  · Slowness in thinking, acting or reacting, speaking, or reading.  · Getting lost or being easily confused.  · Feeling tired all the time or lacking energy (fatigued).  · Feeling drowsy.  · Sleep disturbances.    Sleeping more than usual.    Sleeping less than usual.    Trouble falling asleep.    Trouble sleeping (insomnia).  · Loss of balance or feeling lightheaded or dizzy.  · Nausea or vomiting.  · Numbness or tingling.  · Increased sensitivity to:    Sounds.    Lights.    Distractions.  · Vision problems or eyes that tire  easily.  · Diminished sense of taste or smell.  · Ringing in the ears.  · Mood changes such as feeling sad or anxious.  · Becoming easily irritated or angry for little or no reason.  · Lack of motivation.  · Seeing or hearing things other people do not see or hear (hallucinations).  DIAGNOSIS  Your health care provider can usually diagnose a concussion based on a description of your injury and symptoms. He or she will ask whether you passed out (lost consciousness) and whether you are having trouble remembering events that happened right before and during your injury.  Your evaluation might include:  · A brain scan to look for signs of injury to the brain. Even if the test shows no injury, you may still have a concussion.  · Blood tests to be sure other problems are not present.  TREATMENT  · Concussions are usually treated in an emergency department, in urgent care, or at a clinic. You may need to stay in the hospital overnight for further treatment.  · Tell your health care provider if you are taking any medicines, including prescription medicines, over-the-counter medicines, and natural remedies. Some medicines, such as blood thinners (anticoagulants) and aspirin, may increase the chance of complications. Also tell your health care   provider whether you have had alcohol or are taking illegal drugs. This information may affect treatment.  · Your health care provider will send you home with important instructions to follow.  · How fast you will recover from a concussion depends on many factors. These factors include how severe your concussion is, what part of your brain was injured, your age, and how healthy you were before the concussion.  · Most people with mild injuries recover fully. Recovery can take time. In general, recovery is slower in older persons. Also, persons who have had a concussion in the past or have other medical problems may find that it takes longer to recover from their current injury.  HOME  CARE INSTRUCTIONS  General Instructions  · Carefully follow the directions your health care provider gave you.  · Only take over-the-counter or prescription medicines for pain, discomfort, or fever as directed by your health care provider.  · Take only those medicines that your health care provider has approved.  · Do not drink alcohol until your health care provider says you are well enough to do so. Alcohol and certain other drugs may slow your recovery and can put you at risk of further injury.  · If it is harder than usual to remember things, write them down.  · If you are easily distracted, try to do one thing at a time. For example, do not try to watch TV while fixing dinner.  · Talk with family members or close friends when making important decisions.  · Keep all follow-up appointments. Repeated evaluation of your symptoms is recommended for your recovery.  · Watch your symptoms and tell others to do the same. Complications sometimes occur after a concussion. Older adults with a brain injury may have a higher risk of serious complications, such as a blood clot on the brain.  · Tell your teachers, school nurse, school counselor, coach, athletic trainer, or work manager about your injury, symptoms, and restrictions. Tell them about what you can or cannot do. They should watch for:    Increased problems with attention or concentration.    Increased difficulty remembering or learning new information.    Increased time needed to complete tasks or assignments.    Increased irritability or decreased ability to cope with stress.    Increased symptoms.  · Rest. Rest helps the brain to heal. Make sure you:    Get plenty of sleep at night. Avoid staying up late at night.    Keep the same bedtime hours on weekends and weekdays.    Rest during the day. Take daytime naps or rest breaks when you feel tired.  · Limit activities that require a lot of thought or concentration. These include:    Doing homework or job-related  work.    Watching TV.    Working on the computer.  · Avoid any situation where there is potential for another head injury (football, hockey, soccer, basketball, martial arts, downhill snow sports and horseback riding). Your condition will get worse every time you experience a concussion. You should avoid these activities until you are evaluated by the appropriate follow-up health care providers.  Returning To Your Regular Activities  You will need to return to your normal activities slowly, not all at once. You must give your body and brain enough time for recovery.  · Do not return to sports or other athletic activities until your health care provider tells you it is safe to do so.  · Ask   your health care provider when you can drive, ride a bicycle, or operate heavy machinery. Your ability to react may be slower after a brain injury. Never do these activities if you are dizzy.  · Ask your health care provider about when you can return to work or school.  Preventing Another Concussion  It is very important to avoid another brain injury, especially before you have recovered. In rare cases, another injury can lead to permanent brain damage, brain swelling, or death. The risk of this is greatest during the first 7-10 days after a head injury. Avoid injuries by:  · Wearing a seat belt when riding in a car.  · Drinking alcohol only in moderation.  · Wearing a helmet when biking, skiing, skateboarding, skating, or doing similar activities.  · Avoiding activities that could lead to a second concussion, such as contact or recreational sports, until your health care provider says it is okay.  · Taking safety measures in your home.    Remove clutter and tripping hazards from floors and stairways.    Use grab bars in bathrooms and handrails by stairs.    Place non-slip mats on floors and in bathtubs.    Improve lighting in dim areas.  SEEK MEDICAL CARE IF:  · You have increased problems paying attention or  concentrating.  · You have increased difficulty remembering or learning new information.  · You need more time to complete tasks or assignments than before.  · You have increased irritability or decreased ability to cope with stress.  · You have more symptoms than before.  Seek medical care if you have any of the following symptoms for more than 2 weeks after your injury:  · Lasting (chronic) headaches.  · Dizziness or balance problems.  · Nausea.  · Vision problems.  · Increased sensitivity to noise or light.  · Depression or mood swings.  · Anxiety or irritability.  · Memory problems.  · Difficulty concentrating or paying attention.  · Sleep problems.  · Feeling tired all the time.  SEEK IMMEDIATE MEDICAL CARE IF:  · You have severe or worsening headaches. These may be a sign of a blood clot in the brain.  · You have weakness (even if only in one hand, leg, or part of the face).  · You have numbness.  · You have decreased coordination.  · You vomit repeatedly.  · You have increased sleepiness.  · One pupil is larger than the other.  · You have convulsions.  · You have slurred speech.  · You have increased confusion. This may be a sign of a blood clot in the brain.  · You have increased restlessness, agitation, or irritability.  · You are unable to recognize people or places.  · You have neck pain.  · It is difficult to wake you up.  · You have unusual behavior changes.  · You lose consciousness.  MAKE SURE YOU:  · Understand these instructions.  · Will watch your condition.  · Will get help right away if you are not doing well or get worse.     This information is not intended to replace advice given to you by your health care provider. Make sure you discuss any questions you have with your health care provider.     Document Released: 10/08/2003 Document Revised: 08/08/2014 Document Reviewed: 02/07/2013  Elsevier Interactive Patient Education ©2016 [employer]
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
## 2016-05-30 ENCOUNTER — Ambulatory Visit: Payer: BC Managed Care – PPO | Admitting: Family Medicine

## 2016-05-30 ENCOUNTER — Encounter: Payer: Self-pay | Admitting: Osteopathic Medicine

## 2016-05-30 ENCOUNTER — Ambulatory Visit (INDEPENDENT_AMBULATORY_CARE_PROVIDER_SITE_OTHER): Payer: BC Managed Care – PPO | Admitting: Osteopathic Medicine

## 2016-05-30 VITALS — BP 142/100 | HR 77 | Ht 61.0 in | Wt 162.0 lb

## 2016-05-30 DIAGNOSIS — G4709 Other insomnia: Secondary | ICD-10-CM | POA: Diagnosis not present

## 2016-05-30 DIAGNOSIS — F332 Major depressive disorder, recurrent severe without psychotic features: Secondary | ICD-10-CM | POA: Diagnosis not present

## 2016-05-30 DIAGNOSIS — F419 Anxiety disorder, unspecified: Secondary | ICD-10-CM | POA: Diagnosis not present

## 2016-05-30 MED ORDER — AMITRIPTYLINE HCL 50 MG PO TABS: 25.0000 mg | ORAL_TABLET | Freq: Every day | ORAL | 1 refills | Status: DC

## 2016-05-30 NOTE — Progress Notes (Signed)
HPI: Julie Martin is a 45 y.o. female  who [facility] today, 05/30/16,  for chief complaint of:  Chief Complaint  Patient presents with  . Medication Problem    welbutrin     Significant anxiety/depression problems as of late.   Has been on Wellbutrin for some time, increase in dosage caused similar problems for her so went back down on the dose. Lately he has been under increasing amounts of stress, difficulty controlling moods and dealing with stressors, problems with wife as well. Went off of the medication a few days ago.  Prozac and Zoloft in the past. Insomnia is a problem for the patient. Waking is a significant concern and she is reluctant to start any medication that may have this side effect associated with it.  Difficulty controlling moods  Suicidal thoughts more in line w/ passive death wish but no plan. Patient is here today seeking help, wants to get better, wants to address the problems present in her marriage she has sought counseling and has appointment upcoming in the next 2 weeks   Past medical, surgical, social and family history reviewed: No past medical history on file. No past surgical history on file. Social History  Substance Use Topics  . Smoking status: Former Games developermoker  . Smokeless tobacco: Never Used  . Alcohol use Yes   No family history on file.   Current medication list and allergy/intolerance information reviewed:   Current Outpatient Prescriptions on File Prior to Visit  Medication Sig Dispense Refill  . buPROPion (WELLBUTRIN XL) 150 MG 24 hr tablet TAKE 1 TABLET (150 MG TOTAL) BY MOUTH DAILY. 30 tablet 4  . CALCIUM PO Take by mouth.    . Diclofenac Sodium 2 % SOLN 2 application twice a day. 1 Bottle 2  . Liraglutide -Weight Management (SAXENDA) 18 MG/3ML SOPN Inject 0.6 mg into the skin daily. Increase by .6mg  weekly until at 3mg  daily. Please include pen needles. 4 pen 2  .  lisinopril-hydrochlorothiazide (PRINZIDE,ZESTORETIC) 10-12.5 MG tablet Take 1 tablet by mouth daily. 30 tablet 2  . Multiple Vitamin (MULTIVITAMIN) tablet Take 1 tablet by mouth daily.    . Omega-3 Fatty Acids (OMEGA 3 PO) Take by mouth.    . phentermine 37.5 MG capsule Take 37.5 mg by mouth every morning.     No current facility-administered medications on file prior to visit.    Allergies  Allergen Reactions  . Betadine [Povidone Iodine]   . Doxycycline   . Sulfonamide Derivatives   . Sulfa Antibiotics Rash      Review of Systems:  Constitutional: No recent illness  HEENT: No  headache, no vision change  Cardiac: No  chest pain, No  pressure, No palpitations  Respiratory:  No  shortness of breath. No  Cough  Gastrointestinal: No  abdominal pain, no change on bowel habits  Musculoskeletal: No new myalgia/arthralgia  Skin: No  Rash  Hem/Onc: No  easy bruising/bleeding, No  abnormal lumps/bumps  Neurologic: No  weakness, No  Dizziness  Psychiatric: +concerns with depression, +concerns with anxiety, + sleep problems   Exam:  BP (!) 142/100   Pulse 77   Ht 5\' 1"  (1.549 m)   Wt 162 lb (73.5 kg)   BMI 30.61 kg/m   Constitutional: VS see above. General Appearance: alert, well-developed, well-nourished, NAD  Eyes: Normal lids and conjunctive, non-icteric sclera  Ears, Nose, Mouth, Throat: MMM, Normal external inspection ears/nares/mouth/lips/gums.  Neck: No masses, trachea midline.  Respiratory: Normal respiratory effort.  Musculoskeletal: Gait normal. Symmetric and independent movement of all extremities  Neurological: Normal balance/coordination. No tremor.  Skin: warm, dry, intact.   Psychiatric: Normal judgment/insight. Depressed/anxious, tearful mood and affect. Oriented x3. No suicidal ideation at this time. No thought disorder.     ASSESSMENT/PLAN:   Advised to get back on the Wellbutrin, consider augmentation with SSRI the patient is very  concerned about side effect of possible weight gain as well as comorbid insomnia issues. For this reason, we'll opt for augmentation with cyclic antidepressant to hopefully help with insomnia. With certainly consider SSRI versus other augmentation such as Abilify or Seroquel versus psych referral if no improvement. Patient advised close follow-up with PCP is in order since medications have been changed.  Patient has arranged for counseling, I advised her to let us know if she needs any assistance with this process or if she will not be able to be seen for a while we may be able to pay some things along with a referral  Safety plan in place: Patient knows to call 911 or present to the closest emergency department or she is also familiar with [facility]. Additionally, she can call this office at any time to be connected to a nurse or to the provider on call after hours.  Severe episode of recurrent major depressive disorder, without psychotic features (HCC) - Plan: amitriptyline (ELAVIL) 50 MG tablet  Anxiety  Other insomnia - Plan: amitriptyline (ELAVIL) 50 MG tablet      Visit summary with medication list and pertinent instructions was printed for patient to review. All questions at time of visit were answered - patient instructed to contact office with any additional concerns. ER/RTC precautions were reviewed with the patient. Follow-up plan: Return in about 2 weeks (around 06/13/2016) for MEDICATIONS FOR ANXIETY/DEPRESSION.

## 2016-06-01 ENCOUNTER — Other Ambulatory Visit: Payer: Self-pay | Admitting: *Deleted

## 2016-06-01 DIAGNOSIS — F332 Major depressive disorder, recurrent severe without psychotic features: Secondary | ICD-10-CM

## 2016-06-01 DIAGNOSIS — G4709 Other insomnia: Secondary | ICD-10-CM

## 2016-06-01 MED ORDER — AMITRIPTYLINE HCL 50 MG PO TABS: 25.0000 mg | ORAL_TABLET | Freq: Every day | ORAL | 1 refills | Status: DC

## 2016-06-01 NOTE — Progress Notes (Signed)
That was my error, I fixed the instructions, it automatically populates sig in the prescription when I use one that's saved in favorites, I think I accidentally did this one. [facility] with instructions to cancel the previous one, let me know if any other problems.

## 2016-06-01 NOTE — Progress Notes (Signed)
So apparently [facility] [place] said they couldn't fill the amitriptyline because they couldn't understand the directions. Patient called and states she wanted the rx to be sent to the [facility] [address] I'm not sure why they didn't understand the directions but I did just put on the rx after then one tab PO qhs the words thereafter. So Im routing this to you so that you can see what I put on the prescription and send to the pharm. The pharm has been change to the appropriate one

## 2016-06-20 ENCOUNTER — Other Ambulatory Visit: Payer: Self-pay | Admitting: *Deleted

## 2016-06-20 MED ORDER — LISINOPRIL-HYDROCHLOROTHIAZIDE 10-12.5 MG PO TABS: 1.0000 | ORAL_TABLET | Freq: Every day | ORAL | 0 refills | Status: DC

## 2016-06-28 ENCOUNTER — Encounter: Payer: Self-pay | Admitting: Physician Assistant

## 2016-06-28 ENCOUNTER — Ambulatory Visit (INDEPENDENT_AMBULATORY_CARE_PROVIDER_SITE_OTHER): Payer: BC Managed Care – PPO | Admitting: Physician Assistant

## 2016-06-28 VITALS — BP 130/83 | HR 77 | Ht 61.0 in | Wt 160.0 lb

## 2016-06-28 DIAGNOSIS — F332 Major depressive disorder, recurrent severe without psychotic features: Secondary | ICD-10-CM

## 2016-06-28 DIAGNOSIS — G4709 Other insomnia: Secondary | ICD-10-CM | POA: Diagnosis not present

## 2016-06-28 DIAGNOSIS — F419 Anxiety disorder, unspecified: Secondary | ICD-10-CM | POA: Diagnosis not present

## 2016-06-28 DIAGNOSIS — I10 Essential (primary) hypertension: Secondary | ICD-10-CM | POA: Diagnosis not present

## 2016-06-28 MED ORDER — LISINOPRIL-HYDROCHLOROTHIAZIDE 10-12.5 MG PO TABS: 1.0000 | ORAL_TABLET | Freq: Every day | ORAL | 1 refills | Status: DC

## 2016-06-28 NOTE — Progress Notes (Signed)
   Subjective:    Patient ID: Julie Martin, female    DOB: 06/03/1971, 45 y.o.   MRN: [id number]  HPI Pt is a 45 yo female who presents to the clinic to have BP recheck. She was seen earlier this month by Dr. Lyn HollingsheadAlexander. She was given elavil for sleep and restarted back on wellbutrin. Her BP was up at that visit. She was very anxious and had not been sleeping. She is doing much better now. She is sleeping well. She is going to a counselor weekly. She denies any headaches, chest pains, SOB, dizziness.      Review of Systems  All other systems reviewed and are negative.      Objective:   Physical Exam  Constitutional: She is oriented to person, place, and time. She appears well-developed and well-nourished.  HENT:  Head: Normocephalic and atraumatic.  Cardiovascular: Normal rate, regular rhythm and normal heart sounds.   Pulmonary/Chest: Effort normal and breath sounds normal.  Neurological: She is alert and oriented to person, place, and time.  Psychiatric: She has a normal mood and affect. Her behavior is normal.          Assessment & Plan:  Marland Kitchen.Marland Kitchen.Julie Martin was seen today for hypertension.  Diagnoses and all orders for this visit:  Essential hypertension, benign -     lisinopril-hydrochlorothiazide (PRINZIDE,ZESTORETIC) 10-12.5 MG tablet; Take 1 tablet by mouth daily.  Severe episode of recurrent major depressive disorder, without psychotic features (HCC)  Other insomnia  Anxiety   Continue on lisinopril/HCTZ. Follow up in 6 months.  Continue on wellbutrin and elavil she does not need refill. Only taking 1 elavil at bedtime.  Continue with counseling.

## 2016-06-29 DIAGNOSIS — G479 Sleep disorder, unspecified: Secondary | ICD-10-CM | POA: Insufficient documentation

## 2016-07-11 ENCOUNTER — Other Ambulatory Visit: Payer: Self-pay | Admitting: Physician Assistant

## 2016-07-17 ENCOUNTER — Other Ambulatory Visit: Payer: Self-pay | Admitting: Osteopathic Medicine

## 2016-08-12 ENCOUNTER — Other Ambulatory Visit: Payer: Self-pay | Admitting: *Deleted

## 2016-08-12 DIAGNOSIS — F332 Major depressive disorder, recurrent severe without psychotic features: Secondary | ICD-10-CM

## 2016-08-12 DIAGNOSIS — G4709 Other insomnia: Secondary | ICD-10-CM

## 2016-08-12 MED ORDER — AMITRIPTYLINE HCL 50 MG PO TABS: 25.0000 mg | ORAL_TABLET | Freq: Every day | ORAL | 1 refills | Status: DC

## 2016-08-12 MED ORDER — LISINOPRIL-HYDROCHLOROTHIAZIDE 10-12.5 MG PO TABS: 1.0000 | ORAL_TABLET | Freq: Every day | ORAL | 0 refills | Status: DC

## 2016-08-15 ENCOUNTER — Other Ambulatory Visit: Payer: Self-pay | Admitting: Physician Assistant

## 2016-10-11 ENCOUNTER — Other Ambulatory Visit: Payer: Self-pay | Admitting: Physician Assistant

## 2016-10-13 ENCOUNTER — Ambulatory Visit (INDEPENDENT_AMBULATORY_CARE_PROVIDER_SITE_OTHER): Payer: BC Managed Care – PPO | Admitting: Physician Assistant

## 2016-10-13 ENCOUNTER — Encounter: Payer: Self-pay | Admitting: Physician Assistant

## 2016-10-13 VITALS — BP 120/82 | HR 76 | Temp 98.0°F | Wt 150.0 lb

## 2016-10-13 DIAGNOSIS — H938X3 Other specified disorders of ear, bilateral: Secondary | ICD-10-CM

## 2016-10-13 NOTE — Progress Notes (Signed)
HPI:                                                                Julie OhmDebra L Martin is a 46 y.o. female who [facility] today for bilateral ear pain  Patient reports bilateral ear fullness and postnasal drip x 3 days. Denies fever, chills, sinus pressure, congestion, sore throat or cough. Patient states she has a history of recurrent ear infections. Past surg hx significant for b/l tympanostomy tubes as an adult. She is not currently followed by an ENT.   Past Medical History:  Diagnosis Date  . Anxiety   . Depression   . Essential hypertension, benign 03/16/2016  . Hypothyroidism 02/26/2014   Resolved after birth of son 9 years ago.    . Obesity    Past Surgical History:  Procedure Laterality Date  . SALIVARY GLAND SURGERY Left   . TYMPANOSTOMY Bilateral 1998   Social History  Substance Use Topics  . Smoking status: Former Games developermoker  . Smokeless tobacco: Never Used  . Alcohol use Yes   family history is not on file.  ROS: negative except as noted in the HPI  Medications: Current Outpatient Prescriptions  Medication Sig Dispense Refill  . amitriptyline (ELAVIL) 50 MG tablet Take 0.5-2 tablets (25-100 mg total) by mouth at bedtime. 60 tablet 1  . buPROPion (WELLBUTRIN XL) 150 MG 24 hr tablet TAKE 1 TABLET (150 MG TOTAL) BY MOUTH DAILY. 30 tablet 2  . CALCIUM PO Take by mouth.    . Diclofenac Sodium 2 % SOLN 2 application twice a day. 1 Bottle 2  . lisinopril-hydrochlorothiazide (PRINZIDE,ZESTORETIC) 10-12.5 MG tablet Take 1 tablet by mouth daily. 90 tablet 1  . lisinopril-hydrochlorothiazide (PRINZIDE,ZESTORETIC) 10-12.5 MG tablet Take 1 tablet by mouth daily. 90 tablet 0  . Multiple Vitamin (MULTIVITAMIN) tablet Take 1 tablet by mouth daily.    . Omega-3 Fatty Acids (OMEGA 3 PO) Take by mouth.     No current facility-administered medications for this visit.    Allergies  Allergen Reactions  . Betadine [Povidone  Iodine]   . Doxycycline   . Sulfonamide Derivatives   . Sulfa Antibiotics Rash       Objective:  BP 120/82   Pulse 76   Temp 98 F (36.7 C) (Oral)   Wt 150 lb (68 kg)   BMI 28.34 kg/m  Gen: well-groomed, cooperative, not ill-appearing, no distress HEENT: external canals clear, TM's without erythema or bulging, some mild scarring, no mastoid tenderness, oropharynx clear, moist mucus membranes Pulm: Normal work of breathing, normal phonation, clear to auscultation bilaterally, no wheezes, rales or rhonchi CV: Normal rate, regular rhythm, s1 and s2 distinct, no murmurs, clicks or rubs  Neuro: alert and oriented x 3, EOM's intact Lymph: no preauricular, postauricular, submandibular, cervical or tonsillar adenopathy Skin: warm and dry, no rashes or lesions on exposed skin, no cyanosis   No results found for this or any previous visit (from the past 72 hour(s)). No results found.    Assessment and Plan: 46 y.o. female with   1. Ear fullness, bilateral - no evidence of otitis media today - declines ENT referral - reassurance provided - if patient develops pain/fever, will send antibiotic  Patient education and  anticipatory guidance given Patient agrees with treatment plan Follow-up as needed if symptoms worsen or fail to improve  Darlyne Russian PA-C

## 2016-10-31 ENCOUNTER — Other Ambulatory Visit: Payer: Self-pay | Admitting: Osteopathic Medicine

## 2016-10-31 DIAGNOSIS — G4709 Other insomnia: Secondary | ICD-10-CM

## 2016-10-31 DIAGNOSIS — F332 Major depressive disorder, recurrent severe without psychotic features: Secondary | ICD-10-CM

## 2016-11-02 ENCOUNTER — Encounter: Payer: Self-pay | Admitting: Physician Assistant

## 2016-11-02 ENCOUNTER — Ambulatory Visit (INDEPENDENT_AMBULATORY_CARE_PROVIDER_SITE_OTHER): Payer: BC Managed Care – PPO | Admitting: Physician Assistant

## 2016-11-02 VITALS — BP 120/77 | HR 98 | Ht 61.0 in | Wt 145.0 lb

## 2016-11-02 DIAGNOSIS — I951 Orthostatic hypotension: Secondary | ICD-10-CM

## 2016-11-02 DIAGNOSIS — R000 Tachycardia, unspecified: Secondary | ICD-10-CM | POA: Diagnosis not present

## 2016-11-02 NOTE — Progress Notes (Signed)
   Subjective:    Patient ID: Julie Martin, female    DOB: 1971-04-29, 46 y.o.   MRN: [id number]  HPI  Pt is a 46 yo female who presents to the clinic with dizziness and passing out 2 times in the last week. She denies hitting her head. She has felt dizzy with position changes for a few months. She has been losing weight over the past year. She has dropped from 200lbs to 145lbs. She is on lisinopril/hctz. She denies any palpitations, headaches, SOB, wheezing, CP, arm pain, jaw pain. Both times passing out was when she was standing or moving into a different position.   Review of Systems  All other systems reviewed and are negative.      Objective:   Physical Exam  Constitutional: She is oriented to person, place, and time. She appears well-developed and well-nourished.  HENT:  Head: Normocephalic and atraumatic.  Cardiovascular: Normal rate, regular rhythm and normal heart sounds.   Pulmonary/Chest: Effort normal and breath sounds normal. She has no wheezes.  Neurological: She is alert and oriented to person, place, and time.  Psychiatric: She has a normal mood and affect. Her behavior is normal.          Assessment & Plan:  Julie Martin KitchenMarland KitchenMckinsey was seen today for loss of consciousness.  Diagnoses and all orders for this visit:  Sinus tachycardia -     EKG 12-Lead  Orthostatic hypotension -     EKG 12-Lead   Orthostatic blood pressure positive for 26 mmhg change systolic and greater than 10 on bottom.    EKG- NSR at 87. No ST elevation or arrhthymias.  Recheck pulse and normal.   Discussed orthostatic hypotension. Stopped BP medication today. Perhaps after all her weight loss she does not need medication any more.  Discussed being careful with position changes.  Follow up in 3 weeks.  HO given.

## 2016-11-02 NOTE — Patient Instructions (Signed)
Orthostatic Hypotension Orthostatic hypotension is a sudden drop in blood pressure that happens when you quickly change positions, such as when you get up from a seated or lying position. Blood pressure is a measurement of how strongly, or weakly, your blood is pressing against the walls of your arteries. Arteries are blood vessels that carry blood from your heart throughout your body. When blood pressure is too low, you may not get enough blood to your brain or to the rest of your organs. This can cause weakness, light-headedness, rapid heartbeat, and fainting. This can last for just a few seconds or for up to a few minutes. Orthostatic hypotension is usually not a serious problem. However, if it happens frequently or gets worse, it may be a sign of something more serious. What are the causes? This condition may be caused by:  Sudden changes in posture, such as standing up quickly after you have been sitting or lying down.  Blood loss.  Loss of body fluids (dehydration).  Heart problems.  Hormone (endocrine) problems.  Pregnancy.  Severe infection.  Lack of certain nutrients.  Severe allergic reactions (anaphylaxis).  Certain medicines, such as blood pressure medicine or medicines that make the body lose excess fluids (diuretics). Sometimes, this condition can be caused by not taking medicine as directed, such as taking too much of a certain medicine. What increases the risk? Certain factors can make you more likely to develop orthostatic hypotension, including:  Age. Risk increases as you get older.  Conditions that affect the heart or the central nervous system.  Taking certain medicines, such as blood pressure medicine or diuretics.  Being pregnant. What are the signs or symptoms? Symptoms of this condition may include:  Weakness.  Light-headedness.  Dizziness.  Blurred vision.  Fatigue.  Rapid heartbeat.  Fainting, in severe cases. How is this diagnosed? This  condition is diagnosed based on:  Your medical history.  Your symptoms.  Your blood pressure measurement. Your health care provider will check your blood pressure when you are:  Lying down.  Sitting.  Standing. A blood pressure reading is recorded as two numbers, such as "120 over 80" (or 120/80). The first ("top") number is called the systolic pressure. It is a measure of the pressure in your arteries as your heart beats. The second ("bottom") number is called the diastolic pressure. It is a measure of the pressure in your arteries when your heart relaxes between beats. Blood pressure is measured in a unit called mm Hg. Healthy blood pressure for adults is 120/80. If your blood pressure is below 90/60, you may be diagnosed with hypotension. Other information or tests that may be used to diagnose orthostatic hypotension include:  Your other vital signs, such as your heart rate and temperature.  Blood tests.  Tilt table test. For this test, you will be safely secured to a table that moves you from a lying position to an upright position. Your heart rhythm and blood pressure will be monitored during the test. How is this treated? Treatment for this condition may include:  Changing your diet. This may involve eating more salt (sodium) or drinking more water.  Taking medicines to raise your blood pressure.  Changing the dosage of certain medicines you are taking that might be lowering your blood pressure.  Wearing compression stockings. These stockings help to prevent blood clots and reduce swelling in your legs. In some cases, you may need to go to the hospital for:  Fluid replacement. This means you will   receive fluids through an IV tube.  Blood replacement. This means you will receive donated blood through an IV tube (transfusion).  Treating an infection or heart problems, if this applies.  Monitoring. You may need to be monitored while medicines that you are taking wear  off. Follow these instructions at home: Eating and drinking    Drink enough fluid to keep your urine clear or pale yellow.  Eat a healthy diet and follow instructions from your health care provider about eating or drinking restrictions. A healthy diet includes:  Fresh fruits and vegetables.  Whole grains.  Lean meats.  Low-fat dairy products.  Eat extra salt only as directed. Do not add extra salt to your diet unless your health care provider told you to do that.  Eat frequent, small meals.  Avoid standing up suddenly after eating. Medicines   Take over-the-counter and prescription medicines only as told by your health care provider.  Follow instructions from your health care provider about changing the dosage of your current medicines, if this applies.  Do not stop or adjust any of your medicines on your own. General instructions   Wear compression stockings as told by your health care provider.  Get up slowly from lying down or sitting positions. This gives your blood pressure a chance to adjust.  Avoid hot showers and excessive heat as directed by your health care provider.  Return to your normal activities as told by your health care provider. Ask your health care provider what activities are safe for you.  Do not use any products that contain nicotine or tobacco, such as cigarettes and e-cigarettes. If you need help quitting, ask your health care provider.  Keep all follow-up visits as told by your health care provider. This is important. Contact a health care provider if:  You vomit.  You have diarrhea.  You have a fever for more than 2-3 days.  You feel more thirsty than usual.  You feel weak and tired. Get help right away if:  You have chest pain.  You have a fast or irregular heartbeat.  You develop numbness in any part of your body.  You cannot move your arms or your legs.  You have trouble speaking.  You become sweaty or feel  lightheaded.  You faint.  You feel short of breath.  You have trouble staying awake.  You feel confused. This information is not intended to replace advice given to you by your health care provider. Make sure you discuss any questions you have with your health care provider. Document Released: 07/08/2002 Document Revised: 04/05/2016 Document Reviewed: 01/08/2016 Elsevier Interactive Patient Education  2017 [employer] Inc[employer]  

## 2017-01-11 ENCOUNTER — Other Ambulatory Visit: Payer: Self-pay | Admitting: Physician Assistant

## 2017-01-12 ENCOUNTER — Other Ambulatory Visit: Payer: Self-pay | Admitting: Physician Assistant

## 2017-01-12 DIAGNOSIS — F332 Major depressive disorder, recurrent severe without psychotic features: Secondary | ICD-10-CM

## 2017-01-12 DIAGNOSIS — G4709 Other insomnia: Secondary | ICD-10-CM

## 2017-02-20 ENCOUNTER — Other Ambulatory Visit: Payer: Self-pay | Admitting: Physician Assistant

## 2017-02-20 DIAGNOSIS — F332 Major depressive disorder, recurrent severe without psychotic features: Secondary | ICD-10-CM

## 2017-02-20 DIAGNOSIS — G4709 Other insomnia: Secondary | ICD-10-CM

## 2017-03-22 ENCOUNTER — Other Ambulatory Visit: Payer: Self-pay | Admitting: Physician Assistant

## 2017-03-22 DIAGNOSIS — F332 Major depressive disorder, recurrent severe without psychotic features: Secondary | ICD-10-CM

## 2017-03-22 DIAGNOSIS — G4709 Other insomnia: Secondary | ICD-10-CM

## 2017-03-23 ENCOUNTER — Ambulatory Visit (INDEPENDENT_AMBULATORY_CARE_PROVIDER_SITE_OTHER): Payer: BC Managed Care – PPO | Admitting: Family Medicine

## 2017-03-23 VITALS — BP 143/89 | HR 79 | Temp 98.7°F | Wt 151.0 lb

## 2017-03-23 DIAGNOSIS — I10 Essential (primary) hypertension: Secondary | ICD-10-CM | POA: Diagnosis not present

## 2017-03-23 DIAGNOSIS — R011 Cardiac murmur, unspecified: Secondary | ICD-10-CM

## 2017-03-23 DIAGNOSIS — E039 Hypothyroidism, unspecified: Secondary | ICD-10-CM | POA: Diagnosis not present

## 2017-03-23 DIAGNOSIS — H1132 Conjunctival hemorrhage, left eye: Secondary | ICD-10-CM

## 2017-03-23 MED ORDER — LISINOPRIL 10 MG PO TABS: 10.0000 mg | ORAL_TABLET | Freq: Every day | ORAL | 1 refills | Status: DC

## 2017-03-23 NOTE — Patient Instructions (Signed)
Thank you for coming in today. Get labs soon.  Start Lisinopril daily.    Subconjunctival Hemorrhage Subconjunctival hemorrhage is bleeding that happens between the white part of your eye (sclera) and the clear membrane that covers the outside of your eye (conjunctiva). There are many tiny blood vessels near the surface of your eye. A subconjunctival hemorrhage happens when one or more of these vessels breaks and bleeds, causing a red patch to appear on your eye. This is similar to a bruise. Depending on the amount of bleeding, the red patch may only cover a small area of your eye or it may cover the entire visible part of the sclera. If a lot of blood collects under the conjunctiva, there may also be swelling. Subconjunctival hemorrhages do not affect your vision or cause pain, but your eye may feel irritated if there is swelling. Subconjunctival hemorrhages usually do not require treatment, and they disappear on their own within two weeks. What are the causes? This condition may be caused by:  Mild trauma, such as rubbing your eye too hard.  Severe trauma or blunt injuries.  Coughing, sneezing, or vomiting.  Straining, such as when lifting a heavy object.  High blood pressure.  Recent eye surgery.  A history of diabetes.  Certain medicines, especially blood thinners (anticoagulants).  Other conditions, such as eye tumors, bleeding disorders, or blood vessel abnormalities.  Subconjunctival hemorrhages can happen without an obvious cause. What are the signs or symptoms? Symptoms of this condition include:  A bright red or dark red patch on the white part of the eye. ? The red area may spread out to cover a larger area of the eye before it goes away. ? The red area may turn brownish-yellow before it goes away.  Swelling.  Mild eye irritation.  How is this diagnosed? This condition is diagnosed with a physical exam. If your subconjunctival hemorrhage was caused by trauma, your  health care provider may refer you to an eye specialist (ophthalmologist) or another specialist to check for other injuries. You may have other tests, including:  An eye exam.  A blood pressure check.  Blood tests to check for bleeding disorders.  If your subconjunctival hemorrhage was caused by trauma, X-rays or a CT scan may be done to check for other injuries. How is this treated? Usually, no treatment is needed. Your health care provider may recommend eye drops or cold compresses to help with discomfort. Follow these instructions at home:  Take over-the-counter and prescription medicines only as directed by your health care provider.  Use eye drops or cold compresses to help with discomfort as directed by your health care provider.  Avoid activities, things, and environments that may irritate or injure your eye.  Keep all follow-up visits as told by your health care provider. This is important. Contact a health care provider if:  You have pain in your eye.  The bleeding does not go away within 3 weeks.  You keep getting new subconjunctival hemorrhages. Get help right away if:  Your vision changes or you have difficulty seeing.  You suddenly develop severe sensitivity to light.  You develop a severe headache, persistent vomiting, confusion, or abnormal tiredness (lethargy).  Your eye seems to bulge or protrude from your eye socket.  You develop unexplained bruises on your body.  You have unexplained bleeding in another area of your body. This information is not intended to replace advice given to you by your health care provider. Make sure you discuss  any questions you have with your health care provider. Document Released: 07/18/2005 Document Revised: 03/13/2016 Document Reviewed: 09/24/2014 Elsevier Interactive Patient Education  2018 [employer].   Lisinopril tablets What is this medicine? LISINOPRIL (lyse IN oh pril) is an ACE inhibitor. This medicine is used  to treat high blood pressure and heart failure. It is also used to protect the heart immediately after a heart attack. This medicine may be used for other purposes; ask your health care provider or pharmacist if you have questions. COMMON BRAND NAME(S): Prinivil, Zestril What should I tell my health care provider before I take this medicine? They need to know if you have any of these conditions: -diabetes -heart or blood vessel disease -kidney disease -low blood pressure -previous swelling of the tongue, face, or lips with difficulty breathing, difficulty swallowing, hoarseness, or tightening of the throat -an unusual or allergic reaction to lisinopril, other ACE inhibitors, insect venom, foods, dyes, or preservatives -pregnant or trying to get pregnant -breast-feeding How should I use this medicine? Take this medicine by mouth with a glass of water. Follow the directions on your prescription label. You may take this medicine with or without food. If it upsets your stomach, take it with food. Take your medicine at regular intervals. Do not take it more often than directed. Do not stop taking except on your doctor's advice. Talk to your pediatrician regarding the use of this medicine in children. Special care may be needed. While this drug may be prescribed for children as young as 82 years of age for selected conditions, precautions do apply. Overdosage: If you think you have taken too much of this medicine contact a poison control center or emergency room at once. NOTE: This medicine is only for you. Do not share this medicine with others. What if I miss a dose? If you miss a dose, take it as soon as you can. If it is almost time for your next dose, take only that dose. Do not take double or extra doses. What may interact with this medicine? Do not take this medicine with any of the following medications: -hymenoptera venom -sacubitril; valsartan This medicines may also interact with the  following medications: -aliskiren -angiotensin receptor blockers, like losartan or valsartan -certain medicines for diabetes -diuretics -everolimus -gold compounds -lithium -NSAIDs, medicines for pain and inflammation, like ibuprofen or naproxen -potassium salts or supplements -salt substitutes -sirolimus -temsirolimus This list may not describe all possible interactions. Give your health care provider a list of all the medicines, herbs, non-prescription drugs, or dietary supplements you use. Also tell them if you smoke, drink alcohol, or use illegal drugs. Some items may interact with your medicine. What should I watch for while using this medicine? Visit your doctor or health care professional for regular check ups. Check your blood pressure as directed. Ask your doctor what your blood pressure should be, and when you should contact him or her. Do not treat yourself for coughs, colds, or pain while you are using this medicine without asking your doctor or health care professional for advice. Some ingredients may increase your blood pressure. Women should inform their doctor if they wish to become pregnant or think they might be pregnant. There is a potential for serious side effects to an unborn child. Talk to your health care professional or pharmacist for more information. Check with your doctor or health care professional if you get an attack of severe diarrhea, nausea and vomiting, or if you sweat a lot. The loss  of too much body fluid can make it dangerous for you to take this medicine. You may get drowsy or dizzy. Do not drive, use machinery, or do anything that needs mental alertness until you know how this drug affects you. Do not stand or sit up quickly, especially if you are an older patient. This reduces the risk of dizzy or fainting spells. Alcohol can make you more drowsy and dizzy. Avoid alcoholic drinks. Avoid salt substitutes unless you are told otherwise by your doctor or  health care professional. What side effects may I notice from receiving this medicine? Side effects that you should report to your doctor or health care professional as soon as possible: -allergic reactions like skin rash, itching or hives, swelling of the hands, feet, face, lips, throat, or tongue -breathing problems -signs and symptoms of kidney injury like trouble passing urine or change in the amount of urine -signs and symptoms of increased potassium like muscle weakness; chest pain; or fast, irregular heartbeat -signs and symptoms of liver injury like dark yellow or brown urine; general ill feeling or flu-like symptoms; light-colored stools; loss of appetite; nausea; right upper belly pain; unusually weak or tired; yellowing of the eyes or skin -signs and symptoms of low blood pressure like dizziness; feeling faint or lightheaded, falls; unusually weak or tired -stomach pain with or without nausea and vomiting Side effects that usually do not require medical attention (report to your doctor or health care professional if they continue or are bothersome): -changes in taste -cough -dizziness -fever -headache -sensitivity to light This list may not describe all possible side effects. Call your doctor for medical advice about side effects. You may report side effects to FDA at 1-800-FDA-1088. Where should I keep my medicine? Keep out of the reach of children. Store at room temperature between 15 and 30 degrees C (59 and 86 degrees F). Protect from moisture. Keep container tightly closed. Throw away any unused medicine after the expiration date. NOTE: This sheet is a summary. It may not cover all possible information. If you have questions about this medicine, talk to your doctor, pharmacist, or health care provider.  2018 Elsevier/Gold Standard (2015-09-07 12:52:35)   Heart Murmur A heart murmur is an extra sound that is caused by chaotic blood flow. The murmur can be heard as a "hum" or  "whoosh" sound when blood flows through the heart. The heart has four areas called chambers. Valves separate the upper and lower chambers from each other (tricuspid valve and mitral valve) and separate the lower chambers of the heart from pathways that lead away from the heart (aortic valve and pulmonary valve). Normally, the valves open to let blood flow through or out of your heart, and then they shut to keep the blood from flowing backward. There are two types of heart murmurs:  Innocent murmurs. Most people with this type of heart murmur do not have a heart problem. Many children have innocent heart murmurs. Your health care provider may suggest some basic testing to find out whether your murmur is an innocent murmur. If an innocent heart murmur is found, there is no need for further tests or treatment and no need to restrict activities or stop playing sports.  Abnormal murmurs. These types of murmurs can occur in children and adults. Abnormal murmurs may be a sign of a more serious heart condition, such as a heart defect present at birth (congenital defect) or heart valve disease.  What are the causes? This condition is caused  by heart valves that are not working properly. In children, abnormal heart murmurs are typically caused by congenital defects. In adults, abnormal murmurs are usually from heart valve problems caused by disease, infection, or aging. Three types of heart valve defects can cause a murmur:  Regurgitation. This is when blood leaks back through the valve in the wrong direction.  Mitral valve prolapse. This is when the mitral valve of the heart has a loose flap and does not close tightly.  Stenosis. This is when a valve does not open enough and blocks blood flow.  This condition may also be caused by:  Pregnancy.  Fever.  Overactive thyroid gland.  Anemia.  Exercise.  Rapid growth spurts (in children).  What are the signs or symptoms? Innocent murmurs do not  cause symptoms, and many people with abnormal murmurs may or may not have symptoms. If symptoms do develop, they may include:  Shortness of breath.  Blue coloring of the skin, especially on the fingertips.  Chest pain.  Palpitations, or feeling a fluttering or skipped heartbeat.  Fainting.  Persistent cough.  Getting tired much faster than expected.  Swelling in the abdomen, feet, or ankles.  How is this diagnosed? This condition may be diagnosed during a routine physical or other exam. If your health care provider hears a murmur with a stethoscope, he or she will listen for:  Where the murmur is located in your heart.  How long the murmur lasts (duration).  When the murmur is heard during the heartbeat.  How loud the murmur is. This may help the health care provider figure out what is causing the murmur.  You may be referred to a heart specialist (cardiologist). You may also have other tests, including:  Electrocardiogram (ECG or EKG). This test measures the electrical activity of your heart.  Echocardiogram. This test uses high frequency sound waves to make pictures of your heart.  MRI or chest X-ray.  Cardiac catheterization. This test looks at blood flow through the heart.  For children and adults who have an abnormal heart murmur and want to stay active, it is important to complete testing, review test results, and receive recommendations from your health care provider. If heart disease is present, it may not be safe to play or be active. How is this treated? Heart murmurs themselves do not need treatment. In some cases, a heart murmur may go away on its own. If an underlying problem or disease is causing the murmur, you may need treatment. If treatment is needed, it will depend on the type and severity of the disease or heart problem causing the murmur. Treatment may include:  Medicine.  Surgery.  Dietary and lifestyle changes.  Follow these instructions at  home:  Talk with your health care provider before participating in sports or other activities that require a lot of effort and energy (are strenuous).  Learn as much as possible about your condition and any related diseases. Ask your health care provider if you may at risk for any medical emergencies.  Talk with your health care provider about what symptoms you should look out for.  It is up to you to get your test results. Ask your health care provider, or the department that is doing the test, when your results will be ready.  Keep all follow-up visits as told by your health care provider. This is important. Contact a health care provider if:  You feel light-headed.  You are frequently short of breath.  You feel  more tired than usual.  You are having a hard time keeping up with normal activities or fitness routines.  You have swelling in your ankles or feet.  You have chest pain.  You notice that your heart often beats irregularly.  You develop any new symptoms. Get help right away if:  You develop severe chest pain.  You are having trouble breathing.  You have fainting spells.  Your symptoms suddenly get worse. These symptoms may represent a serious problem that is an emergency. Do not wait to see if the symptoms will go away. Get medical help right away. Call your local emergency services (911 in the [place] Do not drive yourself to the hospital. Summary  Normally, the heart valves open to let blood flow through or out of your heart, and then they shut to keep the blood from flowing backward.  Heart murmur is caused by heart valves that are not working properly.  You may need treatment if an underlying problem or disease is causing the heart murmur. Treatment may include medicine, surgery, or dietary and lifestyle changes.  Talk with your health care provider before participating in sports or other activities that require a lot of effort and energy (are  strenuous).  Talk with your health care provider about what symptoms you should watch out for. This information is not intended to replace advice given to you by your health care provider. Make sure you discuss any questions you have with your health care provider. Document Released: 08/25/2004 Document Revised: 07/06/2016 Document Reviewed: 07/06/2016 Elsevier Interactive Patient Education  2017 [employer].

## 2017-03-23 NOTE — Progress Notes (Signed)
Julie Martin is a 46 y.o. female who presents to [facility] today for hypertension and subconjunctival hemorrhage.  Hypertension: Patient has a history of hypertension previously treated with lisinopril/hydrochlorothiazide. She had episodes of orthostasis and the medication was discontinued. Her blood pressure was doing pretty well controlled on its own. She notes recently she's been under more stress and had an episode of significantly elevated blood pressure diagnosed in a chiropractor where her systolic was in the 180s. She notes a family history of hypertension and denies a personal history of chest pain palpitations or shortness of breath.  Subconjunctival hemorrhage: Patient awoke this morning with bleeding on the sclera of the lateral left eye. It does not hurt or interferes her vision she feels well otherwise.   Past Medical History:  Diagnosis Date  . Anxiety   . Depression   . Essential hypertension, benign 03/16/2016  . Hypothyroidism 02/26/2014   Resolved after birth of son 9 years ago.    . Obesity    Past Surgical History:  Procedure Laterality Date  . SALIVARY GLAND SURGERY Left   . TYMPANOSTOMY Bilateral 1998   Social History  Substance Use Topics  . Smoking status: Former Games developer  . Smokeless tobacco: Never Used  . Alcohol use Yes   family history is not on file.  ROS as above:  Medications: Current Outpatient Prescriptions  Medication Sig Dispense Refill  . amitriptyline (ELAVIL) 50 MG tablet TAKE 1/2 TO 2 TABLETS BY MOUTH AT BEDTIME. DUE FOR FOLLOW UP VISIT 60 tablet 0  . buPROPion (WELLBUTRIN XL) 150 MG 24 hr tablet TAKE 1 TABLET (150 MG TOTAL) BY MOUTH DAILY. 30 tablet 2  . CALCIUM PO Take by mouth.    . Diclofenac Sodium 2 % SOLN 2 application twice a day. 1 Bottle 2  . Multiple Vitamin (MULTIVITAMIN) tablet Take 1 tablet by mouth  daily.    . Omega-3 Fatty Acids (OMEGA 3 PO) Take by mouth.    Marland Kitchen lisinopril (PRINIVIL,ZESTRIL) 10 MG tablet Take 1 tablet (10 mg total) by mouth daily. 30 tablet 1   No current facility-administered medications for this visit.    Allergies  Allergen Reactions  . Betadine [Povidone Iodine]   . Doxycycline   . Sulfonamide Derivatives   . Sulfa Antibiotics Rash    Health Maintenance Health Maintenance  Topic Date Due  . HIV Screening  01/18/1988  . MAMMOGRAM  02/15/2015  . INFLUENZA VACCINE  03/23/2018 (Originally 03/01/2017)  . PAP SMEAR  04/01/2018  . TETANUS/TDAP  09/18/2019     Exam:  BP (!) 143/89   Pulse 79   Temp 98.7 F (37.1 C) (Oral)   Wt 151 lb (68.5 kg)   BMI 28.53 kg/m  Gen: Well NAD HEENT: EOMI,  MMM Small subconjunctival hemorrhage not interfering with iris left lateral sclera.  Lungs: Normal work of breathing. CTABL Heart: RRR no soft nonradiating murmur present Abd: NABS, Soft. Nondistended, Nontender Exts: Brisk capillary refill, warm and well perfused.    No results found for this or any previous visit (from the past 72 hour(s)). No results found.    Assessment and Plan: 46 y.o. female with  Hypertension: Unclear etiology. Patient also has an undiagnosed heart murmur. Further lab workup listed below as well as echocardiogram. Additionally we'll start treatment with low-dose lisinopril. Patient does not have sex with men therefore pregnancy is not a concern on this medication. Plan to recheck in a  few weeks with PCP.  Subconjunctival hemorrhage: Unclear cause however it does not seem to be causing a problem. Plan for CBC to make sure platelets are normal. Otherwise watchful waiting.   Orders Placed This Encounter  Procedures  . CBC  . COMPLETE METABOLIC PANEL WITH GFR  . TSH  . T4, free  . T3, free  . ECHOCARDIOGRAM COMPLETE    Standing Status:   Future    Standing Expiration Date:   06/23/2018    Order Specific Question:   Where should this  test be performed    Answer:   [facility]    Order Specific Question:   Perflutren DEFINITY (image enhancing agent) should be administered unless hypersensitivity or allergy exist    Answer:   Administer Perflutren    Order Specific Question:   Expected Date:    Answer:   1 month   Meds ordered this encounter  Medications  . lisinopril (PRINIVIL,ZESTRIL) 10 MG tablet    Sig: Take 1 tablet (10 mg total) by mouth daily.    Dispense:  30 tablet    Refill:  1     Discussed warning signs or symptoms. Please see discharge instructions. Patient expresses understanding.

## 2017-03-25 LAB — COMPLETE METABOLIC PANEL WITH GFR
ALBUMIN: 4.1 g/dL (ref 3.6–5.1)
ALK PHOS: 68 U/L (ref 33–115)
ALT: 12 U/L (ref 6–29)
AST: 14 U/L (ref 10–30)
BILIRUBIN TOTAL: 0.3 mg/dL (ref 0.2–1.2)
BUN: 19 mg/dL (ref 7–25)
CALCIUM: 8.8 mg/dL (ref 8.6–10.2)
CO2: 25 mmol/L (ref 20–32)
Chloride: 103 mmol/L (ref 98–110)
Creat: 0.82 mg/dL (ref 0.50–1.10)
GFR, Est Non African American: 87 mL/min (ref >=60)
Glucose, Bld: 70 mg/dL (ref 65–99)
Potassium: 4.2 mmol/L (ref 3.5–5.3)
Sodium: 140 mmol/L (ref 135–146)
Total Protein: 6.4 g/dL (ref 6.1–8.1)

## 2017-03-25 LAB — TSH: TSH: 1.55 m[IU]/L

## 2017-03-25 LAB — CBC
HEMATOCRIT: 37.7 % (ref 35.0–45.0)
HEMOGLOBIN: 12.7 g/dL (ref 11.7–15.5)
MCH: 30.1 pg (ref 27.0–33.0)
MCHC: 33.7 g/dL (ref 32.0–36.0)
MCV: 89.3 fL (ref 80.0–100.0)
MPV: 11 fL (ref 7.5–12.5)
Platelets: 272 10*3/uL (ref 140–400)
RBC: 4.22 MIL/uL (ref 3.80–5.10)
RDW: 13.4 % (ref 11.0–15.0)
WBC: 7.5 10*3/uL (ref 3.8–10.8)

## 2017-03-25 LAB — T3, FREE: T3, Free: 3.1 pg/mL (ref 2.3–4.2)

## 2017-03-25 LAB — T4, FREE: FREE T4: 1.2 ng/dL (ref 0.8–1.8)

## 2017-03-29 ENCOUNTER — Ambulatory Visit (HOSPITAL_BASED_OUTPATIENT_CLINIC_OR_DEPARTMENT_OTHER)
Admission: RE | Admit: 2017-03-29 | Discharge: 2017-03-29 | Disposition: A | Discharge status: Home / Self Care | Payer: BC Managed Care – PPO | Source: Ambulatory Visit | Attending: Family Medicine | Admitting: Family Medicine

## 2017-03-29 DIAGNOSIS — R011 Cardiac murmur, unspecified: Secondary | ICD-10-CM | POA: Diagnosis not present

## 2017-03-29 DIAGNOSIS — I517 Cardiomegaly: Secondary | ICD-10-CM | POA: Insufficient documentation

## 2017-03-29 NOTE — Progress Notes (Signed)
  Echocardiogram 2D Echocardiogram has been performed.  Baylee Mccorkel L Androw 03/29/2017, 3:23 PM

## 2017-04-02 LAB — HM PAP SMEAR: HM Pap smear: NORMAL

## 2017-04-10 ENCOUNTER — Other Ambulatory Visit: Payer: Self-pay | Admitting: Physician Assistant

## 2017-05-15 ENCOUNTER — Other Ambulatory Visit: Payer: Self-pay | Admitting: Physician Assistant

## 2017-05-20 ENCOUNTER — Other Ambulatory Visit: Payer: Self-pay | Admitting: Family Medicine

## 2017-05-20 DIAGNOSIS — I10 Essential (primary) hypertension: Secondary | ICD-10-CM

## 2017-05-29 ENCOUNTER — Other Ambulatory Visit: Payer: Self-pay | Admitting: Physician Assistant

## 2017-06-09 ENCOUNTER — Encounter: Payer: Self-pay | Admitting: Physician Assistant

## 2017-06-09 ENCOUNTER — Ambulatory Visit: Payer: BC Managed Care – PPO | Admitting: Physician Assistant

## 2017-06-09 VITALS — BP 158/89 | HR 59 | Ht 60.98 in | Wt 152.0 lb

## 2017-06-09 DIAGNOSIS — F332 Major depressive disorder, recurrent severe without psychotic features: Secondary | ICD-10-CM | POA: Diagnosis not present

## 2017-06-09 DIAGNOSIS — G4709 Other insomnia: Secondary | ICD-10-CM

## 2017-06-09 DIAGNOSIS — I10 Essential (primary) hypertension: Secondary | ICD-10-CM | POA: Diagnosis not present

## 2017-06-09 MED ORDER — BUPROPION HCL ER (XL) 150 MG PO TB24: 150.0000 mg | ORAL_TABLET | Freq: Every day | ORAL | 1 refills | Status: DC

## 2017-06-09 MED ORDER — LISINOPRIL 20 MG PO TABS: 20.0000 mg | ORAL_TABLET | Freq: Every day | ORAL | 1 refills | Status: DC

## 2017-06-09 MED ORDER — AMITRIPTYLINE HCL 50 MG PO TABS: ORAL_TABLET | ORAL | 5 refills | Status: DC

## 2017-06-09 NOTE — Progress Notes (Signed)
Subjective:    Patient ID: Julie OhmDebra L Whetstone, female    DOB: June 22, 1971, 46 y.o.   MRN: [id number]  HPI  Patient is a 46 year old female who presents to the clinic to follow-up on blood pressure.  She was previously taken off blood pressure medication due to hypertension.  She had to see another provider a few weeks ago and restart blood pressure medication due to hypertension.  She is on lisinopril 10 mg daily.  She denies any chest pains, palpitations, headaches, vision changes.  She is checking her blood pressure randomly and seems to still be running in the 140s over 90s.  Overall she feels like her mood is fairly well controlled.  She is certainly going through a lot of transitions.  She broke up with her partner recently.  She is now single mom.  She continues to work full-time.  She denies any suicidal or homicidal thoughts.  She continues to take Wellbutrin daily.  For the most part amitriptyline is helping significantly with sleep.  She still finds that there are times where she struggles to get to sleep without taking to amitriptyline.  When that happens she is way too groggy in the morning.   .. Active Ambulatory Problems    Diagnosis Date Noted  . Uterine polyp 02/26/2014  . DUB (dysfunctional uterine bleeding) 02/26/2014  . Greater trochanteric bursitis of right hip 02/26/2014  . Hypothyroidism 02/26/2014  . Depression 03/26/2014  . Anxiety 03/26/2014  . 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 Obesity 01/18/2016  . Essential hypertension, benign 03/16/2016  . Concussion without loss of consciousness 04/05/2016  . Other insomnia 06/29/2016  . Orthostatic hypotension 11/02/2016  . Murmur 03/23/2017   Resolved Ambulatory Problems    Diagnosis Date Noted  . Elevated blood pressure 06/12/2015   Past Medical History:  Diagnosis Date  . Anxiety   . Depression   . Essential hypertension, benign 03/16/2016  . Hypothyroidism 02/26/2014  . Obesity      Review  of Systems  All other systems reviewed and are negative.      Objective:   Physical Exam  Constitutional: She is oriented to person, place, and time. She appears well-developed and well-nourished.  HENT:  Head: Normocephalic and atraumatic.  Cardiovascular: Normal rate, regular rhythm and normal heart sounds.  Pulmonary/Chest: Effort normal and breath sounds normal.  Neurological: She is alert and oriented to person, place, and time.  Psychiatric: She has a normal mood and affect. Her behavior is normal.          Assessment & Plan:  Marland Kitchen.Marland Kitchen.Stanton KidneyDebra was seen today for depression and hypertension.  Diagnoses and all orders for this visit:  Essential hypertension, benign -     lisinopril (PRINIVIL,ZESTRIL) 20 MG tablet; Take 1 tablet (20 mg total) daily by mouth.  Severe episode of recurrent major depressive disorder, without psychotic features (HCC) -     buPROPion (WELLBUTRIN XL) 150 MG 24 hr tablet; Take 1 tablet (150 mg total) daily by mouth. Due for follow up visit -     amitriptyline (ELAVIL) 50 MG tablet; TAKE 1 TO 2 TABLETS BY MOUTH AT BEDTIME.  Other insomnia -     amitriptyline (ELAVIL) 50 MG tablet; TAKE 1 TO 2 TABLETS BY MOUTH AT BEDTIME.     .. [facility] 06/09/2017 03/23/2017  Decreased Interest 1 0  Down, Depressed, Hopeless 1 1  PHQ - 2 Score 2 1  Altered sleeping 2 -  Tired, decreased energy  1 -  Change in appetite 1 -  Feeling bad or failure about yourself  1 -  Trouble concentrating 1 -  Moving slowly or fidgety/restless 1 -  Suicidal thoughts 0 -  PHQ-9 Score 9 -   .Marland Kitchen. GAD 7 : Generalized Anxiety Score 06/09/2017  Nervous, Anxious, on Edge 2  Control/stop worrying 2  Worry too much - different things 2  Trouble relaxing 1  Restless 1  Easily annoyed or irritable 0  Afraid - awful might happen 1  Total GAD 7 Score 9   Increase lisinopril to 20 mg.  Encourage patient to continue checking her blood pressure.  Goal blood pressure is under  140/90.  If blood pressure is not coming under 140/90 please follow-up back up in clinic.  Follow-up in 6 months or sooner if needed.  Continue Wellbutrin and amitriptyline.  Discussed increasing Wellbutrin.  She had done this in the past and not had good side effects.  Will stay at the same dose.  Continue using as needed amitriptyline.  Discussed taking 1-1/2 tablet and see if that is enough to help her with sleep and not make her as groggy in the morning.  Also she could try melatonin with a small dosage of amitriptyline.  Encourage good sleep routines.

## 2017-06-27 ENCOUNTER — Ambulatory Visit: Payer: BC Managed Care – PPO | Admitting: Family Medicine

## 2017-06-27 ENCOUNTER — Encounter: Payer: Self-pay | Admitting: Family Medicine

## 2017-06-27 DIAGNOSIS — M545 Low back pain, unspecified: Secondary | ICD-10-CM | POA: Insufficient documentation

## 2017-06-27 MED ORDER — METHOCARBAMOL 500 MG PO TABS: 500.0000 mg | ORAL_TABLET | Freq: Every evening | ORAL | 1 refills | Status: DC | PRN

## 2017-06-27 NOTE — Progress Notes (Signed)
Julie OhmDebra L Lauder is a 46 y.o. female who [facility] today for lower back pain.  Patient has history of bilateral greater trochanteric bursitis, treated with injection, but this issue feels different. Has has left lower back pain for the past 3-4 months, with recent worsening. Patient describes pain over left lower back extending towards SI joint. Patient says pain has become increasingly severe over last 2 weeks, waking her up at night the last several nights. States exercise and yoga have been disrupted due to pain. Suspicious that pain is caused by lumbar quadratus muscle, as stretch in yoga targeting that muscle causes pain. Otherwise, sitting for long periods of time also exacerbates pain. Patient has been taking NSAIDs nearly nightly with little improvement in pain.   Patient denies fevers, night sweats, unintended weight loss. Denies hair, nail, or skin changes. Does endorse bilateral hand stiffness, but has not worsened significantly recently.    Past Medical History:  Diagnosis Date  . Anxiety   . Depression   . Essential hypertension, benign 03/16/2016  . Hypothyroidism 02/26/2014   Resolved after birth of son 9 years ago.    . Obesity    Past Surgical History:  Procedure Laterality Date  . SALIVARY GLAND SURGERY Left   . TYMPANOSTOMY Bilateral 1998   Social History   Tobacco Use  . Smoking status: Former Games developermoker  . Smokeless tobacco: Never Used  Substance Use Topics  . Alcohol use: Yes     ROS:  As above   Medications: Current Outpatient Medications  Medication Sig Dispense Refill  . amitriptyline (ELAVIL) 50 MG tablet TAKE 1 TO 2 TABLETS BY MOUTH AT BEDTIME. 60 tablet 5  . buPROPion (WELLBUTRIN XL) 150 MG 24 hr tablet Take 1 tablet (150 mg total) daily by mouth. Due for follow up visit 90 tablet 1  . CALCIUM PO Take by mouth.    . Diclofenac Sodium 2 % SOLN 2 application twice a day. 1 Bottle 2  . lisinopril  (PRINIVIL,ZESTRIL) 20 MG tablet Take 1 tablet (20 mg total) daily by mouth. 90 tablet 1  . methocarbamol (ROBAXIN) 500 MG tablet Take 1 tablet (500 mg total) by mouth at bedtime as needed for muscle spasms. 30 tablet 1  . Multiple Vitamin (MULTIVITAMIN) tablet Take 1 tablet by mouth daily.    . Omega-3 Fatty Acids (OMEGA 3 PO) Take by mouth.     No current facility-administered medications for this visit.    Allergies  Allergen Reactions  . Betadine [Povidone Iodine]   . Doxycycline   . Sulfonamide Derivatives   . Sulfa Antibiotics Rash     Exam:  BP 128/65   Pulse (!) 55   Ht 5\' 1"  (1.549 m)   Wt 154 lb (69.9 kg)   BMI 29.10 kg/m  General: Well Developed, well nourished, and in no acute distress.  Neuro/Psych: Alert and oriented x3, extra-ocular muscles intact, able to move all 4 extremities, sensation grossly intact. Skin: Warm and dry, no rashes noted.  Respiratory: Not using accessory muscles, speaking in full sentences, trachea midline.  Cardiovascular: Pulses palpable, no extremity edema. Abdomen: Does not appear distended. MSK:  L lower back: Normal in appearance without overlying erythema, skin changes, or rash. No pain to palpation. Straight leg raise negative, though exacerbate pain in lower back. Slump test negative. Bending over and reaching over head and arching back worsens pain.  No results found for this or any previous visit (from the past  48 hour(s)). No results found.  Assessment and Plan: 46 y.o. female with likely lumbosacral strain, quite possibly of lumbar quadratus muscle. Rheumatologic causes are a consideration, but with the lack of other signs and symptoms, this appears unlikely.   Patient amenable to conservative measure including heating pad and TENS unit. Patient will benefit from physical therapy and is amenable to attending.   Patient does have TCA medication which makes her quite sedated the next day, so methacarbamol is likely a better  choice. She can use at night before bed.   Patient should follow up in 6 weeks.   Orders Placed This Encounter  Procedures  . Ambulatory referral to Physical Therapy    Referral Priority:   Routine    Referral Type:   Physical Medicine    Referral Reason:   Specialty Services Required    Requested Specialty:   Physical Therapy   Meds ordered this encounter  Medications  . methocarbamol (ROBAXIN) 500 MG tablet    Sig: Take 1 tablet (500 mg total) by mouth at bedtime as needed for muscle spasms.    Dispense:  30 tablet    Refill:  1    Discussed warning signs or symptoms. Please see discharge instructions. Patient expresses understanding.

## 2017-06-27 NOTE — Patient Instructions (Signed)
Thank you for coming in today. Attend PT.  Try the muscle relaxer at bedtime.  Use a heating pad.  Use a TENS unit.  Recheck with me in 6 weeks.  Return sooner if needed.   TENS UNIT: This is helpful for muscle pain and spasm.   Search and Purchase a TENS 7000 2nd edition at  www.tenspros.com or www.Amazon.com It should be less than $30.     TENS unit instructions: Do not shower or bathe with the unit on Turn the unit off before removing electrodes or batteries If the electrodes lose stickiness add a drop of water to the electrodes after they are disconnected from the unit and place on plastic sheet. If you continued to have difficulty, call the TENS unit company to purchase more electrodes. Do not apply lotion on the skin area prior to use. Make sure the skin is clean and dry as this will help prolong the life of the electrodes. After use, always check skin for unusual red areas, rash or other skin difficulties. If there are any skin problems, does not apply electrodes to the same area. Never remove the electrodes from the unit by pulling the wires. Do not use the TENS unit or electrodes other than as directed. Do not change electrode placement without consultating your therapist or physician. Keep 2 fingers with between each electrode. Wear time ratio is 2:1, on to off times.    For example on for 30 minutes off for 15 minutes and then on for 30 minutes off for 15 minutes   Lumbosacral Strain Lumbosacral strain is an injury that causes pain in the lower back (lumbosacral spine). This injury usually occurs from overstretching the muscles or ligaments along your spine. A strain can affect one or more muscles or cord-like tissues that connect bones to other bones (ligaments). What are the causes? This condition may be caused by:  A hard, direct hit (blow) to the back.  Excessive stretching of the lower back muscles. This may result from: ? A fall. ? Lifting something  heavy. ? Repetitive movements such as bending or crouching.  What increases the risk? The following factors may increase your risk of getting this condition:  Participating in sports or activities that involve: ? A sudden twist of the back. ? Pushing or pulling motions.  Being overweight or obese.  Having poor strength and flexibility, especially tight hamstrings or weak muscles in the back or abdomen.  Having too much of a curve in the lower back.  Having a pelvis that is tilted forward.  What are the signs or symptoms? The main symptom of this condition is pain in the lower back, at the site of the strain. Pain may extend (radiate) down one or both legs. How is this diagnosed? This condition is diagnosed based on:  Your symptoms.  Your medical history.  A physical exam. ? Your health care provider may push on certain areas of your back to determine the source of your pain. ? You may be asked to bend forward, backward, and side to side to assess the severity of your pain and your range of motion.  Imaging tests, such as: ? X-rays. ? MRI.  How is this treated? Treatment for this condition may include:  Putting heat and cold on the affected area.  Medicines to help relieve pain and relax your muscles (muscle relaxants).  NSAIDs to help reduce swelling and discomfort.  When your symptoms improve, it is important to gradually return to your  normal routine as soon as possible to reduce pain, avoid stiffness, and avoid loss of muscle strength. Generally, symptoms should improve within 6 weeks of treatment. However, recovery time varies. Follow these instructions at home: Managing pain, stiffness, and swelling   If directed, put ice on the injured area during the first 24 hours after your strain. ? Put ice in a plastic bag. ? Place a towel between your skin and the bag. ? Leave the ice on for 20 minutes, 2-3 times a day.  If directed, put heat on the affected area as  often as told by your health care provider. Use the heat source that your health care provider recommends, such as a moist heat pack or a heating pad. ? Place a towel between your skin and the heat source. ? Leave the heat on for 20-30 minutes. ? Remove the heat if your skin turns bright red. This is especially important if you are unable to feel pain, heat, or cold. You may have a greater risk of getting burned. Activity  Rest and return to your normal activities as told by your health care provider. Ask your health care provider what activities are safe for you.  Avoid activities that take a lot of energy for as long as told by your health care provider. General instructions  Take over-the-counter and prescription medicines only as told by your health care provider.  Donot drive or use heavy machinery while taking prescription pain medicine.  Do not use any products that contain nicotine or tobacco, such as cigarettes and e-cigarettes. If you need help quitting, ask your health care provider.  Keep all follow-up visits as told by your health care provider. This is important. How is this prevented?  Use correct form when playing sports and lifting heavy objects.  Use good posture when sitting and standing.  Maintain a healthy weight.  Sleep on a mattress with medium firmness to support your back.  Be safe and responsible while being active to avoid falls.  Do at least 150 minutes of moderate-intensity exercise each week, such as brisk walking or water aerobics. Try a form of exercise that takes stress off your back, such as swimming or stationary cycling.  Maintain physical fitness, including: ? Strength. ? Flexibility. ? Cardiovascular fitness. ? Endurance. Contact a health care provider if:  Your back pain does not improve after 6 weeks of treatment.  Your symptoms get worse. Get help right away if:  Your back pain is severe.  You cannot stand or walk.  You have  difficulty controlling when you urinate or when you have a bowel movement.  You feel nauseous or you vomit.  Your feet get very cold.  You have numbness, tingling, weakness, or problems using your arms or legs.  You develop any of the following: ? Shortness of breath. ? Dizziness. ? Pain in your legs. ? Weakness in your buttocks or legs. ? Discoloration of the skin on your toes or legs. This information is not intended to replace advice given to you by your health care provider. Make sure you discuss any questions you have with your health care provider. Document Released: 04/27/2005 Document Revised: 02/05/2016 Document Reviewed: 12/20/2015 [employer]vier Interactive Patient Education  2017 [employer]

## 2017-07-06 ENCOUNTER — Encounter: Payer: Self-pay | Admitting: Rehabilitative and Restorative Service Providers"

## 2017-07-06 ENCOUNTER — Ambulatory Visit: Payer: BC Managed Care – PPO | Admitting: Rehabilitative and Restorative Service Providers"

## 2017-07-06 DIAGNOSIS — R29898 Other symptoms and signs involving the musculoskeletal system: Secondary | ICD-10-CM | POA: Diagnosis not present

## 2017-07-06 DIAGNOSIS — M545 Low back pain, unspecified: Secondary | ICD-10-CM

## 2017-07-06 NOTE — Therapy (Signed)
[facility] [place] [address] [place], [place], [address] Phone: [phone]   Fax:  [phone]  Physical Therapy Evaluation  Patient Details  Name: Julie Martin MRN: [id number] Date of Birth: 1970/12/08 Referring Provider: Dr Clementeen Graham   Encounter Date: 07/06/2017  PT End of Session - 07/06/17 1700    Visit Number  1    Number of Visits  12    Date for PT Re-Evaluation  08/20/17    PT Start Time  1537    PT Stop Time  1648    PT Time Calculation (min)  71 min    Activity Tolerance  Patient tolerated treatment well       Past Medical History:  Diagnosis Date  . Anxiety   . Depression   . Essential hypertension, benign 03/16/2016  . Hypothyroidism 02/26/2014   Resolved after birth of son 9 years ago.    . Obesity     Past Surgical History:  Procedure Laterality Date  . SALIVARY GLAND SURGERY Left   . TYMPANOSTOMY Bilateral 1998    There were no vitals filed for this visit.   Subjective Assessment - 07/06/17 1546    Subjective  Patient reports LBP which started ~ 2 weeks ago. She does not know of any injury in the last few weeks. 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 She has pain when she lying down at night or when she is sleeping. She has some pain with prolonged sitting.     Pertinent History  bursitis bilat hips for several years (6-7 yrs)     How long can you sit comfortably?  20 min     How long can you stand comfortably?  no limit    How long can you walk comfortably?  no limit     Patient Stated Goals  get rid of the pain     Currently in Pain?  Yes    Pain Score  4     Pain Location  Back    Pain Orientation  Left;Lower    Pain Descriptors / Indicators  Aching;Throbbing at night - during the day less intese aching     Pain Type  Acute pain    Pain Radiating Towards  at night into the Lt hip leg; LB     Pain Onset  1 to 4 weeks ago    Pain Frequency  Intermittent     Aggravating Factors   prolonged sitting; lying down to sleep at night; feels pain with certain movements - twisting or bending     Pain Relieving Factors  standing up at night - sitting nothing helps once it starts to hurt         [facility] PT Assessment - 07/06/17 0001      Assessment   Medical Diagnosis  Acute LBP     Referring Provider  Dr Clementeen Graham    Onset Date/Surgical Date  06/15/17    Hand Dominance  Right    Next MD Visit  PRN    Prior Therapy  no       Precautions   Precautions  None      Balance Screen   Has the patient fallen in the past 6 months  No    Has the patient had a decrease in activity level because of a fear of falling?   No    Is the patient reluctant to leave their home because of a fear  of falling?   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     Leisure  yoga; aerial yoga 4 days/wk; runs 30-40 min 3-4 ties/day       Observation/Other Assessments   Focus on Therapeutic Outcomes (FOTO)   31% limitation       Sensation   Additional Comments  WFL's      Posture/Postural Control   Posture Comments  head forward; shoudlers rounded; decreased lumbar lordosis in sitting and standing; sits with hips shifted to one side or the other       AROM   AROM Assessment Site  -- lumbar ROM assessed standing/discomfort flex;Lt lat flex/rot    Right/Left Hip  -- WFL's tight hip ext     Lumbar Flexion  80%    Lumbar Extension  70%    Lumbar - Right Side Bend  80%    Lumbar - Left Side Bend  70%    Lumbar - Right Rotation  60%    Lumbar - Left Rotation  50%      Strength   Overall Strength Comments  5/5 bilat LE's       Flexibility   Hamstrings  WNL's    Quadriceps  WNL's    ITB  WNL's    Piriformis  WFL's       Palpation   Spinal mobility  spinal mobilization WFL's     Palpation comment  tight Lt QL/lats               Objective measurements completed on examination: See above findings.      [facility] Adult PT Treatment/Exercise - 07/06/17 0001      Neuro Re-ed    Neuro Re-ed Details   postural correction - sitting       Lumbar Exercises: Stretches   Lower Trunk Rotation Limitations  QL stretch Rt LE over Lt supine rotationg trunk to Rt 30-45 sec hold x 3     Hip Flexor Stretch  3 reps;30 seconds seated bilat       Lumbar Exercises: Quadruped   [place]/Old Horse  5 reps    Other Quadruped Lumbar Exercises  Child's pose 30-40 sec x 3; walking hands to side to stretch lateral trunk 30-40 sec x 2 to Rt and 1 to Lt       Moist Heat Therapy   Number Minutes Moist Heat  15 Minutes    Moist Heat Location  Lumbar Spine      Electrical Stimulation   Electrical Stimulation Location  Lt QL/lumbar spine     Electrical Stimulation Action  IFC    Electrical Stimulation Parameters  to tolerance    Electrical Stimulation Goals  Pain;Tone      Manual Therapy   Manual therapy comments  pt prone     Soft tissue mobilization  deep tissue work Lt lumbar paraspinals; QL; lats     Myofascial Release  Lt lumbar        Trigger Point Dry Needling - 07/06/17 1659    Consent Given?  Yes    Education Handout Provided  Yes    Muscles Treated Lower Body  -- Lt QL and paraspinals - decreased tightness to palpation            PT Education - 07/06/17 1648    Education provided  Yes  Education Details  HEP DN posture in sitting    Person(s) Educated  Patient    Methods  Explanation;Demonstration;Tactile cues;Verbal cues;Handout    Comprehension  Verbalized understanding;Returned demonstration;Verbal cues required;Tactile cues required          PT Long Term Goals - 07/06/17 1705      PT LONG TERM GOAL #1   Title  Decrease pain in Lt LB/QL area by 50-75% allowing patient to sit for prolonged times and sleep without awakening due to pain 08/20/17    Time  6    Period  Weeks    Status  New       PT LONG TERM GOAL #2   Title  Decrease palpable tightness through Lt lumbar musculature 08/20/17    Time  6    Period  Weeks    Status  New      PT LONG TERM GOAL #3   Title  Independent in HEP 08/20/17    Time  6    Period  Weeks    Status  New      PT LONG TERM GOAL #4   Title  Improve FOTO to </= 20% limitation 08/20/17    Time  6    Period  Weeks    Status  New             Plan - 07/06/17 1701    Clinical Impression Statement  Julie Martin presents with acute LBP which started ~ 2-3 weeks ago with no known injury. She isolates pain to the Lt QL with symptoms primarily reported with lying down to sleep at night or prolonged sitting. She has poor posture and alignment in sitting and standing; limited trunk mobilty; muscular tightness to palpation; muscular imbalance; pain on a daily basis. Julie Martin will benefit from PT to address problems identified.     Clinical Presentation  Stable    Clinical Decision Making  Low    Rehab Potential  Good    PT Frequency  2x / week    PT Duration  6 weeks    PT Treatment/Interventions  Patient/family education;ADLs/Self Care Home Management;Cryotherapy;Electrical Stimulation;Iontophoresis 4mg /ml Dexamethasone;Moist Heat;Ultrasound;Dry needling;Manual techniques;Neuromuscular re-education;Therapeutic activities;Therapeutic exercise    PT Next Visit Plan  review HEP; assess response to DN; progress with piriformis stretch; core stabilization; postural correction - pec stretch/posterior shoulder girdle strengthening; continue manual work/DN; modalities as indicated     Consulted and Agree with Plan of Care  Patient       Patient will benefit from skilled therapeutic intervention in order to improve the following deficits and impairments:  Postural dysfunction, Improper body mechanics, Increased fascial restricitons, Increased muscle spasms, Decreased mobility, Decreased range of motion, Decreased activity tolerance  Visit Diagnosis: Acute  left-sided low back pain without sciatica - Plan: PT plan of care cert/re-cert  Other symptoms and signs involving the musculoskeletal system - Plan: PT plan of care cert/re-cert     Problem List Patient Active Problem List   Diagnosis Date Noted  . Lumbago 06/27/2017  . Murmur 03/23/2017  . Orthostatic hypotension 11/02/2016  . Other insomnia 06/29/2016  . Concussion without loss of consciousness 04/05/2016  . Essential hypertension, benign 03/16/2016  . Obesity 01/18/2016  . 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 Depression 03/26/2014  . Anxiety 03/26/2014  . Uterine polyp 02/26/2014  . DUB (dysfunctional uterine bleeding) 02/26/2014  . Greater trochanteric bursitis of right hip 02/26/2014  . Hypothyroidism 02/26/2014    Airon Sahni Rober MinionP Srihan Brutus PT, MPH  07/06/2017, 5:11 PM  [facility] [address] [place] [address] [place], [place], [address] Phone: [phone]   Fax:  [phone]  Name: Julie Martin MRN: [id number] Date of Birth: August 24, 1970

## 2017-07-06 NOTE — Patient Instructions (Signed)
QL stretch in supine   Cat cow  Child's pose  Walking hands side to side    Trigger Point Dry Needling  . What is Trigger Point Dry Needling (DN)? o DN is a physical therapy technique used to treat muscle pain and dysfunction. Specifically, DN helps deactivate muscle trigger points (muscle knots).  o A thin filiform needle is used to penetrate the skin and stimulate the underlying trigger point. The goal is for a local twitch response (LTR) to occur and for the trigger point to relax. No medication of any kind is injected during the procedure.   . What Does Trigger Point Dry Needling Feel Like?  o The procedure feels different for each individual patient. Some patients report that they do not actually feel the needle enter the skin and overall the process is not painful. Very mild bleeding may occur. However, many patients feel a deep cramping in the muscle in which the needle was inserted. This is the local twitch response.   Marland Kitchen. How Will I feel after the treatment? o Soreness is normal, and the onset of soreness may not occur for a few hours. Typically this soreness does not last longer than two days.  o Bruising is uncommon, however; ice can be used to decrease any possible bruising.  o In rare cases feeling tired or nauseous after the treatment is normal. In addition, your symptoms may get worse before they get better, this period will typically not last longer than 24 hours.   . What Can I do After My Treatment? o Increase your hydration by drinking more water for the next 24 hours. o You may place ice or heat on the areas treated that have become sore, however, do not use heat on inflamed or bruised areas. Heat often brings more relief post needling. o You can continue your regular activities, but vigorous activity is not recommended initially after the treatment for 24 hours. o DN is best combined with other physical therapy such as strengthening, stretching, and other therapies.

## 2017-08-02 ENCOUNTER — Ambulatory Visit: Payer: BC Managed Care – PPO | Admitting: Rehabilitative and Restorative Service Providers"

## 2017-08-02 DIAGNOSIS — R29898 Other symptoms and signs involving the musculoskeletal system: Secondary | ICD-10-CM

## 2017-08-02 DIAGNOSIS — M545 Low back pain, unspecified: Secondary | ICD-10-CM

## 2017-08-02 NOTE — Patient Instructions (Addendum)
Scapula Adduction With Pectoralis Stretch: Low - Standing   Shoulders at 45 hands even with shoulders, keeping weight through legs, shift weight forward until you feel pull or stretch through the front of your chest. Hold _30__ seconds. Do _3__ times, _2-4__ times per day.   Scapula Adduction With Pectoralis Stretch: Mid-Range - Standing   Shoulders at 90 elbows even with shoulders, keeping weight through legs, shift weight forward until you feel pull or strength through the front of your chest. Hold __30_ seconds. Do _3__ times, __2-4_ times per day.   Scapula Adduction With Pectoralis Stretch: High - Standing   Shoulders at 120 hands up high on the doorway, keeping weight on feet, shift weight forward until you feel pull or stretch through the front of your chest. Hold _30__ seconds. Do _3__ times, _2-3__ times per day.   ELBOW: Biceps - Standing    Standing facing  Wall place hands on wall with shoulders at 89 degrees, elbow straight. Turn slightly to right to stretch the front of the left shoulder. Hold _30__ seconds. _3_ reps per set, _1-2__ sets per day   Piriformis Stretch   Lying on back, pull right knee toward opposite shoulder. Hold 30 seconds. Repeat 3 times. Do 2-3 sessions per day.

## 2017-08-02 NOTE — Therapy (Addendum)
[place], [place], [address] Phone: [phone]   Fax:  [phone]  Physical Therapy Treatment  Patient Details  Name: VALITA RIGHTER MRN: [id number] Date of Birth: 1970/11/09 Referring Provider: Dr Lynne Leader    Encounter Date: 08/02/2017  PT End of Session - 08/02/17 0802    Visit Number  2    Number of Visits  12    Date for PT Re-Evaluation  08/20/17    PT Start Time  0800    PT Stop Time  0903    PT Time Calculation (min)  63 min    Activity Tolerance  Patient tolerated treatment well       Past Medical History:  Diagnosis Date  . Anxiety   . Depression   . Essential hypertension, benign 03/16/2016  . Hypothyroidism 02/26/2014   Resolved after birth of son 9 years ago.    . Obesity     Past Surgical History:  Procedure Laterality Date  . SALIVARY GLAND SURGERY Left   . TYMPANOSTOMY Bilateral 1998    There were no vitals filed for this visit.  Subjective Assessment - 08/02/17 0803    Subjective  Patient reports that she was not sore from the DN - feels that really helped. She reports that the LB and hip are "a whole lot better' after the DN. She is now having pain in the whole Lt side. She has some pain in the Lt shoulder intermittently and Lt LB and hip area. She has done some of the stretches every couple of days. She does yoga on a daily basis and runs several days a week. She is using the TENS unit at home shich helps.     Currently in Pain?  Yes    Pain Score  1     Pain Location  Back    Pain Orientation  Left;Lower    Pain Descriptors / Indicators  Aching    Pain Type  Acute pain    Pain Onset  More than a month ago    Pain Frequency  Intermittent         [facility] PT Assessment - 08/02/17 0001      Assessment   Medical Diagnosis  Acute LBP     Referring Provider  Dr Lynne Leader     Onset Date/Surgical Date  06/15/17    Hand Dominance  Right    Next MD Visit  PRN    Prior Therapy  no       AROM   Overall AROM Comments  shoulder ROM grossly WFL's end range tightness Lt > Rt     Lumbar Flexion  90% discomfort     Lumbar Extension  70%    Lumbar - Right Side Bend  80%    Lumbar - Left Side Bend  75%    Lumbar - Right Rotation  60%    Lumbar - Left Rotation  55%      Palpation   Spinal mobility  tender and tight through the coccyx. She has increased tightness Rt > Lt coccyx border     SI assessment   PSIS/crest of pelvis ~ equal in standing and prone; ASIS equal in supine     Palpation comment  tight Lt QL/lats; Lt shoulder anteriorly through pecs and biceps                   [facility] Adult PT Treatment/Exercise - 08/02/17 0001  Neuro Re-ed    Neuro Re-ed Details   postural correction - sitting       Lumbar Exercises: Stretches   Lower Trunk Rotation Limitations  QL stretch Rt LE over Lt supine rotationg trunk to Rt 30-45 sec hold x 3     Hip Flexor Stretch  3 reps;30 seconds seated bilat     Piriformis Stretch  3 reps;30 seconds supine travell       Shoulder Exercises: Stretch   Other Shoulder Stretches  3 way doorway 30 sec x 2     Other Shoulder Stretches  T stretch at wall 30 sec x 2       Moist Heat Therapy   Number Minutes Moist Heat  20 Minutes    Moist Heat Location  Lumbar Spine      Electrical Stimulation   Electrical Stimulation Location  Lt QL/lumbar spine     Electrical Stimulation Action  IFC    Electrical Stimulation Parameters  to tolerance    Electrical Stimulation Goals  Pain;Tone      Manual Therapy   Manual therapy comments  pt prone     Soft tissue mobilization  deep tissue work Lt lumbar paraspinals; QL; lats  deep tissue work Rt of coccyx - decreased pain/tenderness Lt    Myofascial Release  Lt lumbar; hip         Trigger Point Dry Needling - 08/02/17 0844    Consent Given?  Yes    Muscles Treated Lower Body  -- Lt QL - with estim     Gluteus Maximus Response  Palpable increased muscle length     Gluteus Minimus Response  Palpable increased muscle length    Piriformis Response  Palpable increased muscle length           PT Education - 08/02/17 0825    Education provided  Yes    Education Details  HEP     Person(s) Educated  Patient    Methods  Explanation;Demonstration;Tactile cues;Verbal cues;Handout    Comprehension  Verbalized understanding;Returned demonstration;Verbal cues required;Tactile cues required          PT Long Term Goals - 08/02/17 0802      PT LONG TERM GOAL #1   Title  Decrease pain in Lt LB/QL area by 50-75% allowing patient to sit for prolonged times and sleep without awakening due to pain 08/20/17    Time  6    Period  Weeks    Status  On-going      PT LONG TERM GOAL #2   Title  Decrease palpable tightness through Lt lumbar musculature 08/20/17    Time  6    Period  Weeks    Status  On-going      PT LONG TERM GOAL #3   Title  Independent in HEP 08/20/17    Time  6    Period  Weeks    Status  On-going      PT LONG TERM GOAL #4   Title  Improve FOTO to </= 20% limitation 08/20/17    Time  6    Period  Weeks    Status  On-going            Plan - 08/02/17 0854    Clinical Impression Statement  Good response to initial treatment and DN - but patient reports continued pain in the Lt LB but it is less intense. She now c/o pain in the Lt side shoulder to LB into the coccyx.  She has fractured her coccyx ~ 20 years ago and has had some discomfort and pain in that area since the fracture. She responded well with decreased tenderness to palpation Lt coccyx area with work through the [place] - which was significantly tighter than the Lt. Continued palpable tightness Lt QL, lumbar paraspinals, piriformis and gluts. She has not been consistnet with HEP from PT. She was enccouraged to work on specific exercses as instructed. Progressing well toward stated goals of therapy.  Will benefit from continstent therapy appointments(has been seen only one time ~ 3  weeks ago).     Rehab Potential  Good    PT Frequency  2x / week    PT Duration  6 weeks    PT Treatment/Interventions  Patient/family education;ADLs/Self Care Home Management;Cryotherapy;Electrical Stimulation;Iontophoresis 86m/ml Dexamethasone;Moist Heat;Ultrasound;Dry needling;Manual techniques;Neuromuscular re-education;Therapeutic activities;Therapeutic exercise    PT Next Visit Plan  review HEP; assess response to DN; progress with piriformis stretch varying angles travell stretch; core stabilization; postural correction - check pec stretch/add posterior shoulder girdle strengthening; continue manual work/DN; modalities as indicated     Consulted and Agree with Plan of Care  Patient       Patient will benefit from skilled therapeutic intervention in order to improve the following deficits and impairments:  Postural dysfunction, Improper body mechanics, Increased fascial restricitons, Increased muscle spasms, Decreased mobility, Decreased range of motion, Decreased activity tolerance  Visit Diagnosis: Acute left-sided low back pain without sciatica  Other symptoms and signs involving the musculoskeletal system     Problem List Patient Active Problem List   Diagnosis Date Noted  . Lumbago 06/27/2017  . Murmur 03/23/2017  . Orthostatic hypotension 11/02/2016  . Other insomnia 06/29/2016  . Concussion without loss of consciousness 04/05/2016  . Essential hypertension, benign 03/16/2016  . Obesity 01/18/2016  . 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 Depression 03/26/2014  . Anxiety 03/26/2014  . Uterine polyp 02/26/2014  . DUB (dysfunctional uterine bleeding) 02/26/2014  . Greater trochanteric bursitis of right hip 02/26/2014  . Hypothyroidism 02/26/2014    Trany Chernick PNilda SimmerPT, MPH  08/02/2017, 9:04 AM  [facility] [address]Phone: [phone]  Fax:  [phone] Name:  DSCHERYL SANBORNMRN: [id number] of Birth: 604-08-1970 PHYSICAL THERAPY DISCHARGE SUMMARY  Visits from Start of Care: 2  Current functional level related to goals / functional outcomes: See last progress note for discharge status    Remaining deficits: Unknown    Education / Equipment: HEP  Plan: Patient agrees to discharge.  Patient goals were not met. Patient is being discharged due to not returning since the last visit.  ?????     Elektra Wartman P. HHelene KelpPT, MPH 09/13/17 2:43 PM

## 2017-08-08 ENCOUNTER — Other Ambulatory Visit: Payer: Self-pay | Admitting: *Deleted

## 2017-08-08 ENCOUNTER — Ambulatory Visit: Payer: BC Managed Care – PPO | Admitting: Family Medicine

## 2017-08-08 DIAGNOSIS — F332 Major depressive disorder, recurrent severe without psychotic features: Secondary | ICD-10-CM

## 2017-08-09 ENCOUNTER — Ambulatory Visit: Payer: BC Managed Care – PPO | Admitting: Family Medicine

## 2017-08-09 ENCOUNTER — Encounter: Payer: Self-pay | Admitting: Family Medicine

## 2017-08-09 VITALS — BP 129/77 | HR 66 | Ht 62.0 in | Wt 154.0 lb

## 2017-08-09 DIAGNOSIS — M545 Low back pain, unspecified: Secondary | ICD-10-CM

## 2017-08-09 NOTE — Patient Instructions (Signed)
Thank you for coming in today. Keep up with the exercises.  I am happy to do a gait assessment at any time.  Recheck as needed.

## 2017-08-09 NOTE — Progress Notes (Signed)
   Julie OhmDebra L Liese is a 47 y.o. female [facility]. [name] is here to follow-up for her low back pain.  She was seen November 27 for back strain and had a trial of physical therapy.  She notes considerable improvement but still is somewhat symptomatic.  She continues her home exercise program.  She wonders if her gait may be abnormal with running is interested in a gait assessment.   Past Medical History:  Diagnosis Date  . Anxiety   . Depression   . Essential hypertension, benign 03/16/2016  . Hypothyroidism 02/26/2014   Resolved after birth of son 9 years ago.    . Obesity    Past Surgical History:  Procedure Laterality Date  . SALIVARY GLAND SURGERY Left   . TYMPANOSTOMY Bilateral 1998   Social History   Tobacco Use  . Smoking status: Former Games developermoker  . Smokeless tobacco: Never Used  Substance Use Topics  . Alcohol use: Yes     ROS:  As above   Medications: Current Outpatient Medications  Medication Sig Dispense Refill  . amitriptyline (ELAVIL) 50 MG tablet TAKE 1 TO 2 TABLETS BY MOUTH AT BEDTIME. 60 tablet 5  . buPROPion (WELLBUTRIN XL) 150 MG 24 hr tablet Take 1 tablet (150 mg total) daily by mouth. Due for follow up visit 90 tablet 1  . CALCIUM PO Take by mouth.    . Diclofenac Sodium 2 % SOLN 2 application twice a day. 1 Bottle 2  . lisinopril (PRINIVIL,ZESTRIL) 20 MG tablet Take 1 tablet (20 mg total) daily by mouth. 90 tablet 1  . methocarbamol (ROBAXIN) 500 MG tablet Take 1 tablet (500 mg total) by mouth at bedtime as needed for muscle spasms. 30 tablet 1  . Multiple Vitamin (MULTIVITAMIN) tablet Take 1 tablet by mouth daily.    . Omega-3 Fatty Acids (OMEGA 3 PO) Take by mouth.     No current facility-administered medications for this visit.    Allergies  Allergen Reactions  . Betadine [Povidone Iodine]   . Doxycycline   . Sulfonamide Derivatives   . Sulfa Antibiotics Rash      Exam:  BP 129/77   Pulse 66   Ht 5\' 2"  (1.575 m)   Wt 154 lb (69.9 kg)   BMI 28.17 kg/m  General: Well Developed, well nourished, and in no acute distress.  Neuro/Psych: Alert and oriented x3, extra-ocular muscles intact, able to move all 4 extremities, sensation grossly intact. Skin: Warm and dry, no rashes noted.  Respiratory: Not using accessory muscles, speaking in full sentences, trachea midline.  Cardiovascular: Pulses palpable, no extremity edema. Abdomen: Does not appear distended. MSK: L-spine: Normal motion nontender normal gait with walking.    No results found for this or any previous visit (from the past 48 hour(s)). No results found.    Assessment and Plan: 47 y.o. female with  Lumbosacral strain improving.  Continue home exercise program.  Return for gait assessment as needed.  Recheck as needed.    No orders of the defined types were placed in this encounter.  No orders of the defined types were placed in this encounter.   Discussed warning signs or symptoms. Please see discharge instructions. Patient expresses understanding.  I spent 15 minutes with this patient, greater than 50% was face-to-face time counseling regarding treatment plan.

## 2017-08-17 LAB — HM MAMMOGRAPHY

## 2017-09-13 ENCOUNTER — Other Ambulatory Visit: Payer: Self-pay | Admitting: *Deleted

## 2017-09-13 DIAGNOSIS — F332 Major depressive disorder, recurrent severe without psychotic features: Secondary | ICD-10-CM

## 2017-09-13 DIAGNOSIS — G4709 Other insomnia: Secondary | ICD-10-CM

## 2017-09-13 MED ORDER — AMITRIPTYLINE HCL 50 MG PO TABS: ORAL_TABLET | ORAL | 1 refills | Status: DC

## 2017-11-29 ENCOUNTER — Other Ambulatory Visit: Payer: Self-pay | Admitting: Physician Assistant

## 2017-11-29 DIAGNOSIS — F332 Major depressive disorder, recurrent severe without psychotic features: Secondary | ICD-10-CM

## 2017-11-29 DIAGNOSIS — I10 Essential (primary) hypertension: Secondary | ICD-10-CM

## 2018-01-02 ENCOUNTER — Other Ambulatory Visit: Payer: Self-pay | Admitting: Physician Assistant

## 2018-01-02 DIAGNOSIS — F332 Major depressive disorder, recurrent severe without psychotic features: Secondary | ICD-10-CM

## 2018-01-02 DIAGNOSIS — I10 Essential (primary) hypertension: Secondary | ICD-10-CM

## 2018-02-25 ENCOUNTER — Other Ambulatory Visit: Payer: Self-pay | Admitting: Physician Assistant

## 2018-02-25 DIAGNOSIS — I10 Essential (primary) hypertension: Secondary | ICD-10-CM

## 2018-02-25 DIAGNOSIS — F332 Major depressive disorder, recurrent severe without psychotic features: Secondary | ICD-10-CM

## 2018-03-23 ENCOUNTER — Other Ambulatory Visit: Payer: Self-pay | Admitting: Physician Assistant

## 2018-03-23 DIAGNOSIS — G4709 Other insomnia: Secondary | ICD-10-CM

## 2018-03-23 DIAGNOSIS — F332 Major depressive disorder, recurrent severe without psychotic features: Secondary | ICD-10-CM

## 2018-03-28 ENCOUNTER — Ambulatory Visit: Payer: BC Managed Care – PPO | Admitting: Physician Assistant

## 2018-04-23 ENCOUNTER — Ambulatory Visit: Payer: BC Managed Care – PPO | Admitting: Physician Assistant

## 2018-04-23 ENCOUNTER — Encounter: Payer: Self-pay | Admitting: Physician Assistant

## 2018-04-23 VITALS — BP 155/95 | HR 54 | Ht 62.0 in | Wt 167.0 lb

## 2018-04-23 DIAGNOSIS — I10 Essential (primary) hypertension: Secondary | ICD-10-CM

## 2018-04-23 DIAGNOSIS — G479 Sleep disorder, unspecified: Secondary | ICD-10-CM

## 2018-04-23 DIAGNOSIS — F332 Major depressive disorder, recurrent severe without psychotic features: Secondary | ICD-10-CM | POA: Diagnosis not present

## 2018-04-23 MED ORDER — BUPROPION HCL ER (XL) 150 MG PO TB24: 150.0000 mg | ORAL_TABLET | Freq: Every day | ORAL | 4 refills | Status: DC

## 2018-04-23 MED ORDER — LISINOPRIL-HYDROCHLOROTHIAZIDE 10-12.5 MG PO TABS: 1.0000 | ORAL_TABLET | Freq: Every day | ORAL | 2 refills | Status: DC

## 2018-04-23 MED ORDER — TRAZODONE HCL 50 MG PO TABS: 25.0000 mg | ORAL_TABLET | Freq: Every evening | ORAL | 2 refills | Status: DC | PRN

## 2018-04-23 NOTE — Progress Notes (Signed)
Subjective:    Patient ID: Samule OhmDebra L Lynn, female    DOB: [birth year], 47 y.o.   MRN: [id number]  HPI Pt is 47 yo female with HTN, GAD, Depression who presents to the clinic for follow up.   HTN- pt has ran out of medication for BP. She denies any headaches, vision changes, palpitations or CP. She was taking lisinopril 10mg  daily. She reports checking BP and running higher in the 150's over 90's.   She is really stressed with work and home life right now. Her son is having some mental health issues and work is crazy busy. Denies any SI/HC. She is not taking wellbutrin but would like to restart. She is having a lot problems sleeping. She does go to counseling. She is not exercising.   .. Active Ambulatory Problems    Diagnosis Date Noted  . Uterine polyp 02/26/2014  . DUB (dysfunctional uterine bleeding) 02/26/2014  . Greater trochanteric bursitis of right hip 02/26/2014  . Hypothyroidism 02/26/2014  . Depression 03/26/2014  . Anxiety 03/26/2014  . 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 Obesity 01/18/2016  . Essential hypertension, benign 03/16/2016  . Trouble in sleeping 06/29/2016  . Orthostatic hypotension 11/02/2016  . Murmur 03/23/2017  . Lumbago 06/27/2017   Resolved Ambulatory Problems    Diagnosis Date Noted  . Elevated blood pressure 06/12/2015  . Concussion without loss of consciousness 04/05/2016   No Additional Past Medical History    Review of Systems  All other systems reviewed and are negative.      Objective:   Physical Exam  Constitutional: She is oriented to person, place, and time. She appears well-developed and well-nourished.  HENT:  Head: Normocephalic and atraumatic.  Cardiovascular: Normal rate and regular rhythm.  Pulmonary/Chest: Effort normal and breath sounds normal.  Neurological: She is alert and oriented to person, place, and time.  Skin: No rash noted.  Psychiatric: She has a normal mood and affect. Her behavior is  normal.          Assessment & Plan:  Marland Kitchen.Marland Kitchen.Diagnoses and all orders for this visit:  Essential hypertension, benign -     lisinopril-hydrochlorothiazide (PRINZIDE,ZESTORETIC) 10-12.5 MG tablet; Take 1 tablet by mouth daily.  Severe episode of recurrent major depressive disorder, without psychotic features (HCC) -     buPROPion (WELLBUTRIN XL) 150 MG 24 hr tablet; Take 1 tablet (150 mg total) by mouth daily.  Trouble in sleeping -     traZODone (DESYREL) 50 MG tablet; Take 0.5-1 tablets (25-50 mg total) by mouth at bedtime as needed for sleep.    .. [facility] 04/23/2018 06/09/2017 03/23/2017  Decreased Interest 1 1 0  Down, Depressed, Hopeless 1 1 1   PHQ - 2 Score 2 2 1   Altered sleeping 2 2 -  Tired, decreased energy 1 1 -  Change in appetite 2 1 -  Feeling bad or failure about yourself  2 1 -  Trouble concentrating 1 1 -  Moving slowly or fidgety/restless 1 1 -  Suicidal thoughts 1 0 -  PHQ-9 Score 12 9 -  Difficult doing work/chores Somewhat difficult - -   .Marland Kitchen. GAD 7 : Generalized Anxiety Score 04/23/2018 06/09/2017  Nervous, Anxious, on Edge 1 2  Control/stop worrying 1 2  Worry too much - different things 1 2  Trouble relaxing 2 1  Restless 1 1  Easily annoyed or irritable 2 0  Afraid - awful might happen 1 1  Total GAD 7  Score 9 9  Anxiety Difficulty Somewhat difficult -   PAP/mammogram per patient done faxed for copy of results.   BP not controlled but she is not on any medication right now. Per pt continued to be elevated even when she was taking lisinopril daily. Will increase dose today to lisinopril 10-12.5. Recheck in 1 month with nurse visit. Follow up 3 months in office with me.   Restarted wellbutrin. Started trazodone for sleep. Continue with counseling. Consider walking or exercise for stress relief.

## 2018-05-15 ENCOUNTER — Other Ambulatory Visit: Payer: Self-pay | Admitting: Physician Assistant

## 2018-05-15 DIAGNOSIS — G479 Sleep disorder, unspecified: Secondary | ICD-10-CM

## 2018-06-15 ENCOUNTER — Telehealth: Payer: Self-pay | Admitting: Physician Assistant

## 2018-06-15 NOTE — Telephone Encounter (Signed)
..  Call patient: We do not have up to date mammogram. If they have had done please get record. If they have not gotten on ok to order and encourage to get it.   

## 2018-07-11 NOTE — Telephone Encounter (Signed)
Called and spoke with patient. Patient states she has had a [facility]ogram recently at [facility]. I have sent over a request for mammogram results.

## 2018-07-13 NOTE — Telephone Encounter (Signed)
Thank you! Also please ask patient where she goes for pap smear? We don't have one on file.

## 2018-07-15 ENCOUNTER — Other Ambulatory Visit: Payer: Self-pay | Admitting: Physician Assistant

## 2018-07-15 DIAGNOSIS — I10 Essential (primary) hypertension: Secondary | ICD-10-CM

## 2018-07-19 ENCOUNTER — Encounter: Payer: Self-pay | Admitting: Physician Assistant

## 2018-07-23 ENCOUNTER — Ambulatory Visit: Payer: BC Managed Care – PPO | Admitting: Physician Assistant

## 2018-08-03 ENCOUNTER — Ambulatory Visit: Payer: BC Managed Care – PPO | Admitting: Physician Assistant

## 2018-08-03 NOTE — Telephone Encounter (Signed)
Patient no showed to their appointment this afternoon. I will attempt to contact patient to update her health maintenance.

## 2018-08-16 NOTE — Telephone Encounter (Signed)
Called patient and asked about pap. Patient states she currently does not have an OBGYN but is in the process of being set up with the OB in our office Dr. Marice Potter. Patient states she is going to call to make an appointment to establish with her to continue to have her OB care done with [place].   I also rescheduled patient's appointment from the other week.   No further questions or concerns at this time.

## 2018-08-22 ENCOUNTER — Encounter: Payer: Self-pay | Admitting: Physician Assistant

## 2018-08-22 ENCOUNTER — Ambulatory Visit: Payer: BC Managed Care – PPO | Admitting: Physician Assistant

## 2018-08-22 VITALS — BP 128/84 | HR 64 | Ht 62.0 in | Wt 166.0 lb

## 2018-08-22 DIAGNOSIS — I10 Essential (primary) hypertension: Secondary | ICD-10-CM | POA: Diagnosis not present

## 2018-08-22 DIAGNOSIS — Z1231 Encounter for screening mammogram for malignant neoplasm of breast: Secondary | ICD-10-CM

## 2018-08-22 MED ORDER — LISINOPRIL-HYDROCHLOROTHIAZIDE 10-12.5 MG PO TABS: 1.0000 | ORAL_TABLET | Freq: Every day | ORAL | 4 refills | Status: DC

## 2018-08-22 NOTE — Progress Notes (Signed)
   Subjective:    Patient ID: Julie Martin, female    DOB: 1970/10/30, 48 y.o.   MRN: [id number]  HPI  Pt is a 48 yo female with HTN, anxiety, MDD who presents to the clinic for follow up.   She is doing ok. She is really stressed at work. There are lots of changes going on. They are down 3 people. Her dog is dying from liver cancer. She continues to try to lose weight. She is still doing yoga. She is not taking any weight loss medications. BP is 120's over 70's when being checked outside of office. She occasionally will get dizzy only with standing. No other position changes.   She continues to see a therapist twice a month. wellbutrin seems to be working well.   She is scheduling a new appt with Dr. Marice Potter for GYN needs.   .. Active Ambulatory Problems    Diagnosis Date Noted  . Uterine polyp 02/26/2014  . DUB (dysfunctional uterine bleeding) 02/26/2014  . Greater trochanteric bursitis of right hip 02/26/2014  . Hypothyroidism 02/26/2014  . Depression 03/26/2014  . Anxiety 03/26/2014  . 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 Obesity 01/18/2016  . Essential hypertension, benign 03/16/2016  . Trouble in sleeping 06/29/2016  . Orthostatic hypotension 11/02/2016  . Murmur 03/23/2017  . Lumbago 06/27/2017   Resolved Ambulatory Problems    Diagnosis Date Noted  . Elevated blood pressure 06/12/2015  . Concussion without loss of consciousness 04/05/2016   No Additional Past Medical History     Review of Systems  All other systems reviewed and are negative.      Objective:   Physical Exam Vitals signs reviewed.  Constitutional:      Appearance: Normal appearance.  HENT:     Head: Normocephalic and atraumatic.  Cardiovascular:     Rate and Rhythm: Normal rate and regular rhythm.     Pulses: Normal pulses.  Pulmonary:     Effort: Pulmonary effort is normal.     Breath sounds: Normal breath sounds.  Neurological:     General: No focal deficit present.      Mental Status: She is alert and oriented to person, place, and time.  Psychiatric:        Mood and Affect: Mood normal.        Behavior: Behavior normal.           Assessment & Plan:  Marland KitchenMarland KitchenJanasha was seen today for follow-up.  Diagnoses and all orders for this visit:  Essential hypertension, benign -     lisinopril-hydrochlorothiazide (PRINZIDE,ZESTORETIC) 10-12.5 MG tablet; Take 1 tablet by mouth daily.  Visit for screening mammogram -     MM 3D SCREEN BREAST BILATERAL   2nd recheck of BP looked great. Medication refilled for 1 year.  For occasional dizziness with standing. Make sure hydrated consider adding a little bit of salt in diet. Be careful with standing. Follow up as needed.   [facility] will [facility] fax to get copy.   Mammogram ordered.

## 2018-09-12 ENCOUNTER — Ambulatory Visit (INDEPENDENT_AMBULATORY_CARE_PROVIDER_SITE_OTHER): Payer: BC Managed Care – PPO

## 2018-09-12 DIAGNOSIS — Z1231 Encounter for screening mammogram for malignant neoplasm of breast: Secondary | ICD-10-CM

## 2018-09-14 NOTE — Progress Notes (Signed)
Call pt: normal mammogram. Follow up in one year.

## 2019-05-22 ENCOUNTER — Other Ambulatory Visit: Payer: Self-pay | Admitting: Physician Assistant

## 2019-05-22 DIAGNOSIS — F332 Major depressive disorder, recurrent severe without psychotic features: Secondary | ICD-10-CM

## 2019-08-16 ENCOUNTER — Other Ambulatory Visit: Payer: Self-pay | Admitting: Physician Assistant

## 2019-08-16 DIAGNOSIS — F332 Major depressive disorder, recurrent severe without psychotic features: Secondary | ICD-10-CM

## 2019-08-30 ENCOUNTER — Other Ambulatory Visit: Payer: Self-pay | Admitting: Physician Assistant

## 2019-08-30 DIAGNOSIS — F332 Major depressive disorder, recurrent severe without psychotic features: Secondary | ICD-10-CM

## 2019-09-24 ENCOUNTER — Telehealth: Payer: Self-pay

## 2019-09-24 NOTE — Telephone Encounter (Signed)
She can increase lisinopril to 2 tablets and keep appt in morning.

## 2019-09-24 NOTE — Telephone Encounter (Signed)
Julie Martin called and states her blood pressure has been elevated today. 160-165/98-120 mg/dl. Last blood pressure was 163/103. She has been scheduled for tomorrow morning. Denies chest pain, shortness of breath or neck or arm pain.

## 2019-09-24 NOTE — Telephone Encounter (Signed)
Patient made aware.

## 2019-09-25 ENCOUNTER — Ambulatory Visit: Payer: BC Managed Care – PPO | Admitting: Physician Assistant

## 2019-09-25 ENCOUNTER — Encounter: Payer: Self-pay | Admitting: Physician Assistant

## 2019-09-25 VITALS — BP 152/84 | HR 64 | Ht 62.0 in | Wt 171.0 lb

## 2019-09-25 DIAGNOSIS — I10 Essential (primary) hypertension: Secondary | ICD-10-CM

## 2019-09-25 DIAGNOSIS — Z1231 Encounter for screening mammogram for malignant neoplasm of breast: Secondary | ICD-10-CM

## 2019-09-25 LAB — POCT URINALYSIS DIP (CLINITEK)
Bilirubin, UA: NEGATIVE
Blood, UA: NEGATIVE
Glucose, UA: NEGATIVE mg/dL
Ketones, POC UA: NEGATIVE mg/dL
Leukocytes, UA: NEGATIVE
Nitrite, UA: NEGATIVE
POC PROTEIN,UA: NEGATIVE
Spec Grav, UA: 1.025 (ref 1.010–1.025)
Urobilinogen, UA: 0.2 E.U./dL
pH, UA: 6.5 (ref 5.0–8.0)

## 2019-09-25 NOTE — Patient Instructions (Signed)

## 2019-09-25 NOTE — Progress Notes (Signed)
Subjective:    Patient ID: Julie Martin, female    DOB: 02-14-1971, 49 y.o.   MRN: [id number]  HPI  Patient is a 49 year old female with a history of HTN, anxiety, MDD who presents with elevated blood pressure x 2 days. She has been taking her medications as prescribed but noticed her blood pressure spiked as high as ~175/120. 1 day ago her blood pressure still elevated at 160-165/98-120. Admits to new onset of a dull bilateral headache and some vision changes stating she feels like it's been "harder to get my eyes to focus for the past couple days." she has recently seen eye doctor with no vision changes. She is on her computer a lot at work. No URI symptoms.   Patient denies any major recent stressors within the past week. She does admit to weight gain over the past few months with associated bilateral leg swelling.   Denies chest pain, shortness of breath, numbness, tingling, weakness.    Review of Systems  Constitutional: Negative for chills, diaphoresis and fever.  HENT: Negative for congestion, sinus pressure, sore throat and trouble swallowing.   Eyes: Positive for visual disturbance.  Respiratory: Negative for cough, chest tightness and shortness of breath.   Cardiovascular: Negative for chest pain.  Gastrointestinal: Negative for abdominal pain.  Genitourinary: Negative for dysuria.  Neurological: Positive for headaches. Negative for dizziness, weakness and numbness.  All other systems reviewed and are negative.      Objective:   Physical Exam Vitals reviewed.  Constitutional:      General: She is not in acute distress.    Appearance: She is not ill-appearing.  HENT:     Head: Normocephalic.     Comments: No temple tenderness.     Right Ear: Tympanic membrane and external ear normal.     Left Ear: Tympanic membrane and external ear normal.     Nose: Nose normal.     Mouth/Throat:     Mouth: Mucous membranes are moist.  Eyes:     Extraocular Movements: Extraocular  movements intact.     Conjunctiva/sclera: Conjunctivae normal.     Pupils: Pupils are equal, round, and reactive to light.  Cardiovascular:     Rate and Rhythm: Regular rhythm.     Pulses: Normal pulses.     Heart sounds: Normal heart sounds.  Pulmonary:     Effort: Pulmonary effort is normal.     Breath sounds: Normal breath sounds.  Abdominal:     Tenderness: There is no right CVA tenderness or left CVA tenderness.  Musculoskeletal:     Cervical back: Neck supple. No tenderness.     Right lower leg: Edema (scant nonpitting) present.     Left lower leg: Edema (scant nonpitting) present.  Neurological:     Mental Status: She is alert and oriented to person, place, and time.  Psychiatric:        Mood and Affect: Mood normal.        Behavior: Behavior normal.           Assessment & Plan:  Marland KitchenMarland KitchenZaylei was seen today for hypertension.  Diagnoses and all orders for this visit:  Visit for screening mammogram -     MM 3D SCREEN BREAST BILATERAL  Uncontrolled hypertension -     COMPLETE METABOLIC PANEL WITH GFR -     POCT URINALYSIS DIP (CLINITEK)  Essential hypertension, benign   .Marland Kitchen Results for orders placed or performed in visit on 09/25/19  POCT  URINALYSIS DIP (CLINITEK)  Result Value Ref Range   Color, UA yellow yellow   Clarity, UA clear clear   Glucose, UA negative negative mg/dL   Bilirubin, UA negative negative   Ketones, POC UA negative negative mg/dL   Spec Grav, UA 1.025 1.010 - 1.025   Blood, UA negative negative   pH, UA 6.5 5.0 - 8.0   POC PROTEIN,UA negative negative, trace   Urobilinogen, UA 0.2 0.2 or 1.0 E.U./dL   Nitrite, UA Negative Negative   Leukocytes, UA Negative Negative    UA looks great.  Will check CMP.  Per [facility]  No other red flag concerning signs of headache. She has been on computer a lot consider blue screen.  Make sure staying hydrated.  Weight gain can effect BP. No SOB. Recent echo last year.   Increase BP medication to 2 tablets. recheck in 4 weeks. Call sooner with no improvement.  If HA gets worse or any strength or vision changes call office.   Marland KitchenVernetta Honey PA-C, have reviewed and agree with the above documentation in it's entirety.

## 2019-09-25 NOTE — Progress Notes (Signed)
Can let patient know urine looked great. No protein in urine.

## 2019-09-26 LAB — COMPLETE METABOLIC PANEL WITH GFR
AG Ratio: 1.8 (calc) (ref 1.0–2.5)
ALT: 14 U/L (ref 6–29)
AST: 16 U/L (ref 10–35)
Albumin: 4.5 g/dL (ref 3.6–5.1)
Alkaline phosphatase (APISO): 63 U/L (ref 31–125)
BUN: 20 mg/dL (ref 7–25)
CO2: 29 mmol/L (ref 20–32)
Calcium: 9.5 mg/dL (ref 8.6–10.2)
Chloride: 101 mmol/L (ref 98–110)
Creat: 0.73 mg/dL (ref 0.50–1.10)
GFR, Est African American: 114 mL/min/{1.73_m2} (ref >=60)
GFR, Est Non African American: 99 mL/min/{1.73_m2} (ref >=60)
Globulin: 2.5 g/dL (calc) (ref 1.9–3.7)
Glucose, Bld: 80 mg/dL (ref 65–99)
Potassium: 4.4 mmol/L (ref 3.5–5.3)
Sodium: 137 mmol/L (ref 135–146)
Total Bilirubin: 0.5 mg/dL (ref 0.2–1.2)
Total Protein: 7 g/dL (ref 6.1–8.1)

## 2019-09-26 NOTE — Progress Notes (Signed)
[name],   Labs perfect!

## 2019-10-17 ENCOUNTER — Ambulatory Visit: Payer: BC Managed Care – PPO

## 2019-10-17 ENCOUNTER — Ambulatory Visit (INDEPENDENT_AMBULATORY_CARE_PROVIDER_SITE_OTHER): Payer: BC Managed Care – PPO | Admitting: Obstetrics & Gynecology

## 2019-10-17 ENCOUNTER — Other Ambulatory Visit: Payer: Self-pay

## 2019-10-17 ENCOUNTER — Encounter: Payer: Self-pay | Admitting: Obstetrics & Gynecology

## 2019-10-17 VITALS — BP 119/85 | HR 77 | Resp 16 | Ht 63.0 in | Wt 169.0 lb

## 2019-10-17 DIAGNOSIS — Z124 Encounter for screening for malignant neoplasm of cervix: Secondary | ICD-10-CM | POA: Diagnosis not present

## 2019-10-17 DIAGNOSIS — Z01419 Encounter for gynecological examination (general) (routine) without abnormal findings: Secondary | ICD-10-CM | POA: Diagnosis not present

## 2019-10-17 DIAGNOSIS — Z1151 Encounter for screening for human papillomavirus (HPV): Secondary | ICD-10-CM

## 2019-10-17 NOTE — Progress Notes (Signed)
GYNECOLOGY ANNUAL PREVENTATIVE CARE ENCOUNTER NOTE  History:     Julie Martin is a 49 y.o. G71P1030 female here for a routine annual gynecologic exam and to establish gynecologic care.  Current complaints: none.   Denies abnormal vaginal bleeding, discharge, pelvic pain, problems with intercourse or other gynecologic concerns.    Gynecologic History No LMP recorded. Patient has had an ablation. Contraception: none, in lesbian relationship Last Pap: 2018. Results were: normal with negative HPV Last mammogram: 2020. Results were: normal  Obstetric History OB History  Gravida Para Term Preterm AB Living  4 1 1   3     SAB TAB Ectopic Multiple Live Births  3       1    # Outcome Date GA Lbr Len/2nd Weight Sex Delivery Anes PTL Lv  4 SAB           3 SAB           2 SAB           1 Term      Vag-Spont       Past Medical History:  Diagnosis Date  . Abnormal cervical Papanicolaou smear   . Anxiety   . Depression   . Essential hypertension, benign 03/16/2016  . Hypothyroidism 02/26/2014   Resolved after birth of son 9 years ago.    . Obesity     Past Surgical History:  Procedure Laterality Date  . COLPOSCOPY    . DILATION AND CURETTAGE OF UTERUS    . ENDOMETRIAL ABLATION    . SALIVARY GLAND SURGERY Left   . TYMPANOSTOMY Bilateral 1998    Current Outpatient Medications on File Prior to Visit  Medication Sig Dispense Refill  . buPROPion (WELLBUTRIN XL) 150 MG 24 hr tablet Take 1 tablet (150 mg total) by mouth daily. NEEDS APPT 30 tablet 0  . lisinopril-hydrochlorothiazide (PRINZIDE,ZESTORETIC) 10-12.5 MG tablet Take 1 tablet by mouth daily. (Patient taking differently: Take 2 tablets by mouth daily. ) 90 tablet 4  . Multiple Vitamin (MULTIVITAMIN) tablet Take 1 tablet by mouth daily.    . Omega-3 Fatty Acids (OMEGA 3 PO) Take by mouth.    Marland Kitchen OVER THE COUNTER MEDICATION [employer] essentials vitamins (weight loss clinic)    . traZODone (DESYREL) 50 MG tablet TAKE 1/2 TO 1 TABLET  BY MOUTH AT BEDTIME AS NEEDED FOR SLEEP (Patient not taking: Reported on 10/17/2019) 90 tablet 1   No current facility-administered medications on file prior to visit.    Allergies  Allergen Reactions  . Betadine [Povidone Iodine]   . Doxycycline   . Sulfonamide Derivatives   . Sulfa Antibiotics Rash    Social History:  reports that she has quit smoking. Her smoking use included cigarettes. She has never used smokeless tobacco. She reports current alcohol use. She reports that she does not use drugs.  Family History  Problem Relation Age of Onset  . Hypertension Mother   . High Cholesterol Mother   . Diabetes Mother   . Hypertension Father   . High Cholesterol Father   . Diabetes Father   . Thyroid cancer Father   . Hypertension Brother   . Diabetes Brother   . Alzheimer's disease Paternal Grandfather   . Prostate cancer Paternal Grandfather     The following portions of the patient's history were reviewed and updated as appropriate: allergies, current medications, past family history, past medical history, past social history, past surgical history and problem list.  Review of Systems  Pertinent items noted in HPI and remainder of comprehensive ROS otherwise negative.  Physical Exam:  BP 119/85   Pulse 77   Resp 16   Ht 5\' 3"  (1.6 m)   Wt 169 lb (76.7 kg)   BMI 29.94 kg/m  CONSTITUTIONAL: Well-developed, well-nourished female in no acute distress.  HENT:  Normocephalic, atraumatic, External right and left ear normal. Oropharynx is clear and moist EYES: Conjunctivae and EOM are normal. Pupils are equal, round, and reactive to light. No scleral icterus.  NECK: Normal range of motion, supple, no masses.  Normal thyroid.  SKIN: Skin is warm and dry. No rash noted. Not diaphoretic. No erythema. No pallor. MUSCULOSKELETAL: Normal range of motion. No tenderness.  No cyanosis, clubbing, or edema.  2+ distal pulses. NEUROLOGIC: Alert and oriented to person, place, and time.  Normal reflexes, muscle tone coordination.  PSYCHIATRIC: Normal mood and affect. Normal behavior. Normal judgment and thought content. CARDIOVASCULAR: Normal heart rate noted, regular rhythm RESPIRATORY: Clear to auscultation bilaterally. Effort and breath sounds normal, no problems with respiration noted. BREASTS: Symmetric in size. No masses, tenderness, skin changes, nipple drainage, or lymphadenopathy bilaterally. Performed in the presence of a chaperone. ABDOMEN: Soft, no distention noted.  No tenderness, rebound or guarding.  PELVIC: Normal appearing external genitalia and urethral meatus; normal appearing vaginal mucosa and cervix.  No abnormal discharge noted.  Pap smear obtained.  Normal uterine size, no other palpable masses, no uterine or adnexal tenderness.  Performed in the presence of a chaperone.   Assessment and Plan:    1. Well woman exam with routine gynecological exam - Cytology - PAP Will follow up results of pap smear and manage accordingly. Mammogram scheduled later in the month Routine preventative health maintenance measures emphasized. Please refer to After Visit Summary for other counseling recommendations.      , MD, FACOG Obstetrician & Gynecologist, [facility] for [facility]

## 2019-10-17 NOTE — Patient Instructions (Signed)

## 2019-10-18 LAB — CYTOLOGY - PAP
Comment: NEGATIVE
Diagnosis: NEGATIVE
High risk HPV: NEGATIVE

## 2019-10-24 ENCOUNTER — Ambulatory Visit (INDEPENDENT_AMBULATORY_CARE_PROVIDER_SITE_OTHER): Payer: BC Managed Care – PPO

## 2019-10-24 ENCOUNTER — Other Ambulatory Visit: Payer: Self-pay

## 2019-10-24 DIAGNOSIS — Z1231 Encounter for screening mammogram for malignant neoplasm of breast: Secondary | ICD-10-CM | POA: Diagnosis not present

## 2019-10-28 NOTE — Progress Notes (Signed)
Normal mammogram. Follow up in 1 year.

## 2019-11-13 ENCOUNTER — Telehealth: Payer: Self-pay

## 2019-11-13 NOTE — Telephone Encounter (Signed)
Patient will call back if needed.

## 2019-11-13 NOTE — Telephone Encounter (Signed)
[name] called and states she just had the 2 nd Covid-19 vaccine on Monday. I did update her chart. She states she has had a fever and fatigue. She states her son has been diagnosed with mono. She was worried her symptoms may be due to mono. She is feeling better today. I did advise it is possible she was exposed at a younger age and the symptoms may be due to the 2 nd COVID-19 vaccine. She states she will wait and see how her symptoms progress and if they persist or worsen she will call back.

## 2019-11-13 NOTE — Telephone Encounter (Signed)
I do not mind checking her for mono but yes that is common symptoms of vaccine immune response as well.

## 2019-12-10 ENCOUNTER — Other Ambulatory Visit: Payer: Self-pay | Admitting: Physician Assistant

## 2019-12-10 DIAGNOSIS — I10 Essential (primary) hypertension: Secondary | ICD-10-CM

## 2020-04-06 ENCOUNTER — Encounter (HOSPITAL_BASED_OUTPATIENT_CLINIC_OR_DEPARTMENT_OTHER): Payer: Self-pay | Admitting: *Deleted

## 2020-04-06 ENCOUNTER — Emergency Department (HOSPITAL_BASED_OUTPATIENT_CLINIC_OR_DEPARTMENT_OTHER): Payer: BC Managed Care – PPO

## 2020-04-06 ENCOUNTER — Other Ambulatory Visit: Payer: Self-pay

## 2020-04-06 ENCOUNTER — Emergency Department (HOSPITAL_BASED_OUTPATIENT_CLINIC_OR_DEPARTMENT_OTHER)
Admission: EM | Admit: 2020-04-06 | Discharge: 2020-04-06 | Disposition: A | Discharge status: Home / Self Care | Payer: BC Managed Care – PPO | Attending: Emergency Medicine | Admitting: Emergency Medicine

## 2020-04-06 DIAGNOSIS — Y999 Unspecified external cause status: Secondary | ICD-10-CM | POA: Insufficient documentation

## 2020-04-06 DIAGNOSIS — E039 Hypothyroidism, unspecified: Secondary | ICD-10-CM | POA: Insufficient documentation

## 2020-04-06 DIAGNOSIS — Z79899 Other long term (current) drug therapy: Secondary | ICD-10-CM | POA: Diagnosis not present

## 2020-04-06 DIAGNOSIS — Z87891 Personal history of nicotine dependence: Secondary | ICD-10-CM | POA: Diagnosis not present

## 2020-04-06 DIAGNOSIS — Y939 Activity, unspecified: Secondary | ICD-10-CM | POA: Diagnosis not present

## 2020-04-06 DIAGNOSIS — Y929 Unspecified place or not applicable: Secondary | ICD-10-CM | POA: Insufficient documentation

## 2020-04-06 DIAGNOSIS — S60450A Superficial foreign body of right index finger, initial encounter: Secondary | ICD-10-CM | POA: Insufficient documentation

## 2020-04-06 DIAGNOSIS — W458XXA Other foreign body or object entering through skin, initial encounter: Secondary | ICD-10-CM | POA: Diagnosis not present

## 2020-04-06 DIAGNOSIS — T148XXA Other injury of unspecified body region, initial encounter: Secondary | ICD-10-CM

## 2020-04-06 DIAGNOSIS — I10 Essential (primary) hypertension: Secondary | ICD-10-CM | POA: Diagnosis not present

## 2020-04-06 NOTE — ED Notes (Signed)
Patient transported to X-ray 

## 2020-04-06 NOTE — Discharge Instructions (Addendum)
Contact a health care provider if:  You think that a piece of the sliver is still in your skin.  You have signs of infection, including:  New or worsening redness around the wound.  New or worsening tenderness around the wound.  Fluid, blood, or pus coming from the wound.  A bad smell coming from the wound or dressing.  You received a tetanus shot and you have swelling, severe pain, redness, or bleeding at the injection site.  Get help right away if you have:  Red streaks coming from the wound.  An unexplained fever.

## 2020-04-06 NOTE — ED Provider Notes (Signed)
[facility] EMERGENCY DEPARTMENT Provider Note   CSN: [id number] Arrival date & time: 04/06/20  1304     History Chief Complaint  Patient presents with  . Finger Injury    Julie Martin is a 49 y.o. female.  Presents emergency department chief complaint of foreign body in her right index finger.  Patient states that she was gardening yesterday.  She felt she might of gotten a nail or stinger from a wasp nest in her finger.  She has some mild erythema and pain.  She states she try to get it out but could not grasp it.  She denies any numbness tingling or other abnormality in her finger.  HPI     Past Medical History:  Diagnosis Date  . Abnormal cervical Papanicolaou smear   . Anxiety   . Depression   . Essential hypertension, benign 03/16/2016  . Hypothyroidism 02/26/2014   Resolved after birth of son 9 years ago.    . Obesity     Patient Active Problem List   Diagnosis Date Noted  . Lumbago 06/27/2017  . Murmur 03/23/2017  . Orthostatic hypotension 11/02/2016  . Trouble in sleeping 06/29/2016  . Essential hypertension, benign 03/16/2016  . Obesity 01/18/2016  . 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 Depression 03/26/2014  . Anxiety 03/26/2014  . Greater trochanteric bursitis of right hip 02/26/2014  . Hypothyroidism 02/26/2014    Past Surgical History:  Procedure Laterality Date  . COLPOSCOPY    . DILATION AND CURETTAGE OF UTERUS    . ENDOMETRIAL ABLATION    . SALIVARY GLAND SURGERY Left   . TYMPANOSTOMY Bilateral 1998     OB History    Gravida  4   Para  1   Term  1   Preterm      AB  3   Living        SAB  3   TAB      Ectopic      Multiple      Live Births  1           Family History  Problem Relation Age of Onset  . Hypertension Mother   . High Cholesterol Mother   . Diabetes Mother   . Hypertension Father   . High Cholesterol Father   . Diabetes Father   . Thyroid cancer Father   . Hypertension  Brother   . Diabetes Brother   . Alzheimer's disease Paternal Grandfather   . Prostate cancer Paternal Grandfather     Social History   Tobacco Use  . Smoking status: Former Smoker    Types: Cigarettes  . Smokeless tobacco: Never Used  Vaping Use  . Vaping Use: Never used  Substance Use Topics  . Alcohol use: Yes    Comment: occassionally  . Drug use: Never    Home Medications Prior to Admission medications   Medication Sig Start Date End Date Taking? Authorizing Provider  buPROPion (WELLBUTRIN XL) 150 MG 24 hr tablet Take 1 tablet (150 mg total) by mouth daily. NEEDS APPT 09/02/19   Jomarie Longs, PA-C  lisinopril-hydrochlorothiazide (ZESTORETIC) 10-12.5 MG tablet TAKE 1 TABLET BY MOUTH EVERY DAY 12/10/19   Breeback, Jade L, PA-C  Multiple Vitamin (MULTIVITAMIN) tablet Take 1 tablet by mouth daily.    [provider]  Omega-3 Fatty Acids (OMEGA 3 PO) Take by mouth.    [provider]  OVER THE COUNTER MEDICATION Blue essentials vitamins (weight loss clinic)  [provider]  traZODone (DESYREL) 50 MG tablet TAKE 1/2 TO 1 TABLET BY MOUTH AT BEDTIME AS NEEDED FOR SLEEP Patient not taking: Reported on 10/17/2019 05/16/18   Carlis Stable, PA-C    Allergies    Betadine [povidone iodine], Doxycycline, Sulfonamide derivatives, and Sulfa antibiotics  Review of Systems   Review of Systems Positive for wound, negative for numbness or tingling  Physical Exam Updated Vital Signs BP (!) 142/94 (BP Location: Left Arm)   Pulse 66   Temp 98.5 F (36.9 C) (Oral)   Resp 18   Ht 5\' 3"  (1.6 m)   Wt 77.1 kg   SpO2 100%   BMI 30.11 kg/m   Physical Exam Vitals and nursing note reviewed.  Constitutional:      General: She is not in acute distress.    Appearance: She is well-developed. She is not diaphoretic.  HENT:     Head: Normocephalic and atraumatic.  Eyes:     General: No scleral icterus.    Conjunctiva/sclera: Conjunctivae normal.    Cardiovascular:     Rate and Rhythm: Normal rate and regular rhythm.     Heart sounds: Normal heart sounds. No murmur heard.  No friction rub. No gallop.   Pulmonary:     Effort: Pulmonary effort is normal. No respiratory distress.     Breath sounds: Normal breath sounds.  Abdominal:     General: Bowel sounds are normal. There is no distension.     Palpations: Abdomen is soft. There is no mass.     Tenderness: There is no abdominal tenderness. There is no guarding.  Musculoskeletal:     Cervical back: Normal range of motion.  Skin:    General: Skin is warm and dry.     Comments: Small foreign body noted in the skin of the index finger on the right  Neurological:     Mental Status: She is alert and oriented to person, place, and time.  Psychiatric:        Behavior: Behavior normal.     ED Results / Procedures / Treatments   Labs (all labs ordered are listed, but only abnormal results are displayed) Labs Reviewed - No data to display  EKG None  Radiology DG Finger Index Right  Result Date: 04/06/2020 CLINICAL DATA:  Nail in right index finger. EXAM: RIGHT INDEX FINGER 2+V COMPARISON:  None. FINDINGS: There is no evidence of fracture or dislocation. There is no evidence of arthropathy or other focal bone abnormality. Soft tissues are unremarkable. No radiopaque foreign body is noted. IMPRESSION: Negative. Electronically Signed   By: 06/06/2020 M.D.   On: 04/06/2020 13:32    Procedures .Foreign Body Removal  Date/Time: 04/06/2020 2:46 PM Performed by: 06/06/2020, PA-C Authorized by: Arthor Captain, PA-C  Body area: skin Removal mechanism: forceps Depth: subcutaneous Complexity: simple 1 objects recovered. Objects recovered: thorn Post-procedure assessment: foreign body removed Patient tolerance: patient tolerated the procedure well with no immediate complications   (including critical care time)  Medications Ordered in ED Medications - No data to  display  ED Course  I have reviewed the triage vital signs and the nursing notes.  Pertinent labs & imaging results that were available during my care of the patient were reviewed by me and considered in my medical decision making (see chart for details).    MDM Rules/Calculators/A&P  Patient with small thorn in the right index finger.  I personally reviewed plain film which shows no obvious retained foreign bodies.  Small thorn was easily removed with forceps.  No evidence of infection.  Patient appears otherwise appropriate for discharge at this time with outpatient follow-up.  Discussed return precautions. Final Clinical Imp patient with aression(s) / ED Diagnoses Final diagnoses:  None    Rx / DC Orders ED Discharge Orders    None       Arthor Captain, PA-C 04/06/20 1450    Linwood Dibbles, MD 04/07/20 [phone]

## 2020-04-06 NOTE — ED Triage Notes (Signed)
Right index finger with 'nail' in the first knuckle. States that she was pulling ivy in the yard and noticed 'tact' nails laying around. Noted to have a swollen, red finger.

## 2020-06-10 ENCOUNTER — Other Ambulatory Visit: Payer: Self-pay | Admitting: Physician Assistant

## 2020-06-10 DIAGNOSIS — I10 Essential (primary) hypertension: Secondary | ICD-10-CM

## 2020-06-21 ENCOUNTER — Other Ambulatory Visit: Payer: Self-pay | Admitting: Physician Assistant

## 2020-06-21 DIAGNOSIS — F332 Major depressive disorder, recurrent severe without psychotic features: Secondary | ICD-10-CM

## 2020-07-22 ENCOUNTER — Encounter: Payer: Self-pay | Admitting: Physician Assistant

## 2020-07-22 ENCOUNTER — Ambulatory Visit (INDEPENDENT_AMBULATORY_CARE_PROVIDER_SITE_OTHER): Payer: BC Managed Care – PPO | Admitting: Physician Assistant

## 2020-07-22 ENCOUNTER — Other Ambulatory Visit: Payer: Self-pay

## 2020-07-22 VITALS — BP 148/82 | HR 63 | Ht 63.0 in | Wt 187.0 lb

## 2020-07-22 DIAGNOSIS — I10 Essential (primary) hypertension: Secondary | ICD-10-CM | POA: Diagnosis not present

## 2020-07-22 DIAGNOSIS — E6609 Other obesity due to excess calories: Secondary | ICD-10-CM

## 2020-07-22 DIAGNOSIS — Z79899 Other long term (current) drug therapy: Secondary | ICD-10-CM

## 2020-07-22 DIAGNOSIS — F332 Major depressive disorder, recurrent severe without psychotic features: Secondary | ICD-10-CM

## 2020-07-22 DIAGNOSIS — Z23 Encounter for immunization: Secondary | ICD-10-CM

## 2020-07-22 DIAGNOSIS — Z6833 Body mass index (BMI) 33.0-33.9, adult: Secondary | ICD-10-CM

## 2020-07-22 DIAGNOSIS — E039 Hypothyroidism, unspecified: Secondary | ICD-10-CM

## 2020-07-22 DIAGNOSIS — Z13220 Encounter for screening for lipoid disorders: Secondary | ICD-10-CM

## 2020-07-22 DIAGNOSIS — E66811 Obesity, class 1: Secondary | ICD-10-CM

## 2020-07-22 MED ORDER — LISINOPRIL-HYDROCHLOROTHIAZIDE 20-12.5 MG PO TABS: 1.0000 | ORAL_TABLET | Freq: Every day | ORAL | 1 refills | Status: DC

## 2020-07-22 MED ORDER — BUPROPION HCL ER (XL) 150 MG PO TB24: 150.0000 mg | ORAL_TABLET | Freq: Every day | ORAL | 1 refills | Status: DC

## 2020-07-22 NOTE — Progress Notes (Signed)
Subjective:    Patient ID: Julie Martin, female    DOB: [birth year], 49 y.o.   MRN: [id number]  HPI  Pt is a 49 yo female with HTN, hypothyroidism, MDD who presents to the clinic for medication refills.   Pt has been out of wellbutrin and can certainly tell. She is in counseling which helps but her mood has not been great. 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 She would like to restart. No SI/HC.   She is taking her BP medication. No CP, palpitations, headaches. She is checking BP at home and in the 140s as well.   She has gained weight about 25lbs back. She is still doing weight watchers. She is not exercising.   .. Active Ambulatory Problems    Diagnosis Date Noted  . Greater trochanteric bursitis of right hip 02/26/2014  . Hypothyroidism 02/26/2014  . Depression 03/26/2014  . Anxiety 03/26/2014  . 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 Obesity 01/18/2016  . Essential hypertension, benign 03/16/2016  . Trouble in sleeping 06/29/2016  . Orthostatic hypotension 11/02/2016  . Murmur 03/23/2017  . Lumbago 06/27/2017   Resolved Ambulatory Problems    Diagnosis Date Noted  . Uterine polyp 02/26/2014  . DUB (dysfunctional uterine bleeding) 02/26/2014  . Elevated blood pressure 06/12/2015  . Concussion without loss of consciousness 04/05/2016   Past Medical History:  Diagnosis Date  . Abnormal cervical Papanicolaou smear       Review of Systems  All other systems reviewed and are negative.      Objective:   Physical Exam Vitals reviewed.  Constitutional:      Appearance: Normal appearance. She is obese.  Cardiovascular:     Rate and Rhythm: Normal rate and regular rhythm.     Pulses: Normal pulses.     Heart sounds: Normal heart sounds.  Pulmonary:     Effort: Pulmonary effort is normal.     Breath sounds: Normal breath sounds.  Neurological:     General: No focal deficit present.     Mental Status: She is  alert and oriented to person, place, and time.  Psychiatric:        Mood and Affect: Mood normal.      .. Depression screen [facility] 2/9 07/22/2020 08/22/2018 04/23/2018 06/09/2017 03/23/2017  Decreased Interest 1 0 1 1 0  Down, Depressed, Hopeless 2 2 1 1 1   PHQ - 2 Score 3 2 2 2 1   Altered sleeping 2 2 2 2  -  Tired, decreased energy 2 2 1 1  -  Change in appetite 2 1 2 1  -  Feeling bad or failure about yourself  1 2 2 1  -  Trouble concentrating 2 1 1 1  -  Moving slowly or fidgety/restless 1 1 1 1  -  Suicidal thoughts 1 0 1 0 -  PHQ-9 Score 14 11 12 9  -  Difficult doing work/chores Not difficult at all Somewhat difficult Somewhat difficult - -   . GAD 7 : Generalized Anxiety Score 07/22/2020 08/22/2018 04/23/2018 06/09/2017  Nervous, Anxious, on Edge 2 2 1 2   Control/stop worrying 2 2 1 2   Worry too much - different things 2 2 1 2   Trouble relaxing 2 2 2 1   Restless 1 1 1 1   Easily annoyed or irritable 1 2 2  0  Afraid - awful might happen 0 0 1 1  Total GAD 7 Score 10 11 9 9   Anxiety  Difficulty Not difficult at all Somewhat difficult Somewhat difficult -         Assessment & Plan:  Marland KitchenMarland KitchenRoni was seen today for hypertension.  Diagnoses and all orders for this visit:  Essential hypertension, benign -     COMPLETE METABOLIC PANEL WITH GFR -     lisinopril-hydrochlorothiazide (ZESTORETIC) 20-12.5 MG tablet; Take 1 tablet by mouth daily.  Hypothyroidism, unspecified type -     TSH  Screening for lipid disorders -     Lipid Panel w/reflex Direct LDL  Medication management -     CBC with Differential/Platelet  Severe episode of recurrent major depressive disorder, without psychotic features (HCC) -     buPROPion (WELLBUTRIN XL) 150 MG 24 hr tablet; Take 1 tablet (150 mg total) by mouth daily.  Need for Tdap vaccination -     Tdap vaccine greater than or equal to 7yo IM   PHQ/GAD not to goal.  Added back wellbutrin.  Continue counseling.  Discussed meditation.  Follow up  as needed in 6 months.   BP not to goal. Increased lisinopril HCT. Keep monitoring BP at home.  cmp ordered.   Marland Kitchen.Discussed low carb diet with 1500 calories and 80g of protein.  Exercising at least 150 minutes a week.  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  Consider IF. HO given.

## 2020-07-23 LAB — CBC WITH DIFFERENTIAL/PLATELET
Absolute Monocytes: 360 cells/uL (ref 200–950)
Basophils Absolute: 47 cells/uL (ref 0–200)
Basophils Relative: 0.8 %
Eosinophils Absolute: 118 cells/uL (ref 15–500)
Eosinophils Relative: 2 %
HCT: 41.8 % (ref 35.0–45.0)
Hemoglobin: 14.5 g/dL (ref 11.7–15.5)
Lymphs Abs: 1564 cells/uL (ref 850–3900)
MCH: 30.5 pg (ref 27.0–33.0)
MCHC: 34.7 g/dL (ref 32.0–36.0)
MCV: 88 fL (ref 80.0–100.0)
MPV: 11.9 fL (ref 7.5–12.5)
Monocytes Relative: 6.1 %
Neutro Abs: 3811 cells/uL (ref 1500–7800)
Neutrophils Relative %: 64.6 %
Platelets: 247 10*3/uL (ref 140–400)
RBC: 4.75 10*6/uL (ref 3.80–5.10)
RDW: 11.9 % (ref 11.0–15.0)
Total Lymphocyte: 26.5 %
WBC: 5.9 10*3/uL (ref 3.8–10.8)

## 2020-07-23 LAB — COMPLETE METABOLIC PANEL WITH GFR
AG Ratio: 1.5 (calc) (ref 1.0–2.5)
ALT: 14 U/L (ref 6–29)
AST: 16 U/L (ref 10–35)
Albumin: 4.3 g/dL (ref 3.6–5.1)
Alkaline phosphatase (APISO): 82 U/L (ref 31–125)
BUN: 15 mg/dL (ref 7–25)
CO2: 27 mmol/L (ref 20–32)
Calcium: 9.3 mg/dL (ref 8.6–10.2)
Chloride: 103 mmol/L (ref 98–110)
Creat: 0.79 mg/dL (ref 0.50–1.10)
GFR, Est African American: 103 mL/min/{1.73_m2} (ref >=60)
GFR, Est Non African American: 89 mL/min/{1.73_m2} (ref >=60)
Globulin: 2.8 g/dL (calc) (ref 1.9–3.7)
Glucose, Bld: 89 mg/dL (ref 65–99)
Potassium: 4.3 mmol/L (ref 3.5–5.3)
Sodium: 138 mmol/L (ref 135–146)
Total Bilirubin: 0.3 mg/dL (ref 0.2–1.2)
Total Protein: 7.1 g/dL (ref 6.1–8.1)

## 2020-07-23 LAB — LIPID PANEL W/REFLEX DIRECT LDL
Cholesterol: 182 mg/dL (ref <200)
HDL: 67 mg/dL (ref >=50)
LDL Cholesterol (Calc): 95 mg/dL (calc)
Non-HDL Cholesterol (Calc): 115 mg/dL (calc) (ref <130)
Total CHOL/HDL Ratio: 2.7 (calc) (ref <5.0)
Triglycerides: 108 mg/dL (ref <150)

## 2020-07-23 LAB — TSH: TSH: 1.91 mIU/L

## 2020-07-23 NOTE — Progress Notes (Signed)
Labs look GREAT!  Cholesterol, kidney, liver, glucose look great.

## 2020-09-11 ENCOUNTER — Other Ambulatory Visit: Payer: Self-pay | Admitting: Physician Assistant

## 2020-09-11 DIAGNOSIS — I10 Essential (primary) hypertension: Secondary | ICD-10-CM

## 2020-09-19 ENCOUNTER — Emergency Department
Admission: RE | Admit: 2020-09-19 | Discharge: 2020-09-19 | Disposition: A | Discharge status: Home / Self Care | Payer: Self-pay | Source: Ambulatory Visit

## 2020-09-19 ENCOUNTER — Other Ambulatory Visit: Payer: Self-pay

## 2020-09-19 VITALS — BP 137/85 | HR 70 | Temp 98.2°F | Resp 15

## 2020-09-19 DIAGNOSIS — S0501XA Injury of conjunctiva and corneal abrasion without foreign body, right eye, initial encounter: Secondary | ICD-10-CM

## 2020-09-19 MED ORDER — ERYTHROMYCIN 5 MG/GM OP OINT: TOPICAL_OINTMENT | OPHTHALMIC | 0 refills | Status: AC

## 2020-09-19 NOTE — Discharge Instructions (Addendum)
You may use the tetracaine twice daily as needed for pain. Start erythromycin eye ointment tonight at least 1 hour prior to bedtime apply one band on the lower eyelid and point-blank frequently to allow time for the medication to absorb into your eye.  Continue medication for a total of 5 days and avoid wearing contact lenses during treatment.  You may resume your contact lens wearing on day six 1 day after treatment has been completed. If you develop any worsening of eye redness or any increased eye pressure or any difficulty seeing please follow-up immediately with your eye specialist.

## 2020-09-19 NOTE — ED Provider Notes (Signed)
[facility]    CSN: [id number] Arrival date & time: 09/19/20  0932      History   Chief Complaint Chief Complaint  Patient presents with  . Eye Pain    right    HPI Julie Martin is a 50 y.o. female.   HPI Patient presents today with right eye irritation over the last 24 hours.  Patient reports that initially her eye was itching following sleeping overnight and her contact lens she developed irritation accompanied by pain in the right eye.  She noticed severe redness in the eye with clear drainage.  Eyes any pressure behind the eye or any loss of vision.  She has had some blurring of vision when eye has been watery.  She is afebrile no associated URI symptoms.  She was able to remove her contact lens. Past Medical History:  Diagnosis Date  . Abnormal cervical Papanicolaou smear   . Anxiety   . Depression   . Essential hypertension, benign 03/16/2016  . Hypothyroidism 02/26/2014   Resolved after birth of son 9 years ago.    . Obesity     Patient Active Problem List   Diagnosis Date Noted  . Lumbago 06/27/2017  . Murmur 03/23/2017  . Orthostatic hypotension 11/02/2016  . Trouble in sleeping 06/29/2016  . Essential hypertension, benign 03/16/2016  . Obesity 01/18/2016  . 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 Depression 03/26/2014  . Anxiety 03/26/2014  . Greater trochanteric bursitis of right hip 02/26/2014  . Hypothyroidism 02/26/2014    Past Surgical History:  Procedure Laterality Date  . COLPOSCOPY    . DILATION AND CURETTAGE OF UTERUS    . ENDOMETRIAL ABLATION    . SALIVARY GLAND SURGERY Left   . TYMPANOSTOMY Bilateral 1998    OB History    Gravida  4   Para  1   Term  1   Preterm      AB  3   Living        SAB  3   IAB      Ectopic      Multiple      Live Births  1            Home Medications    Prior to Admission medications   Medication Sig Start Date End Date Taking? Authorizing Provider   buPROPion (WELLBUTRIN XL) 150 MG 24 hr tablet Take 1 tablet (150 mg total) by mouth daily. 07/22/20  Yes Breeback, Jade L, PA-C  lisinopril-hydrochlorothiazide (ZESTORETIC) 20-12.5 MG tablet Take 1 tablet by mouth daily. 07/22/20  Yes Breeback, Jade L, PA-C  Multiple Vitamin (MULTIVITAMIN) tablet Take 1 tablet by mouth daily.   Yes [provider]  Omega-3 Fatty Acids (OMEGA 3 PO) Take by mouth.   Yes [provider]    Family History Family History  Problem Relation Age of Onset  . Hypertension Mother   . High Cholesterol Mother   . Diabetes Mother   . Hypertension Father   . High Cholesterol Father   . Diabetes Father   . Thyroid cancer Father   . Hypertension Brother   . Diabetes Brother   . Alzheimer's disease Paternal Grandfather   . Prostate cancer Paternal Grandfather   . Healthy Brother   . Healthy Brother     Social History Social History   Tobacco Use  . Smoking status: Former Smoker    Types: Cigarettes  . Smokeless tobacco: Never Used  Vaping Use  .  Vaping Use: Never used  Substance Use Topics  . Alcohol use: Yes    Comment: occassionally  . Drug use: Never     Allergies   Betadine [povidone iodine], Doxycycline, Sulfonamide derivatives, and Sulfa antibiotics   Review of Systems Review of Systems Pertinent negatives listed in HPI   Physical Exam Triage Vital Signs ED Triage Vitals  Enc Vitals Group     BP 09/19/20 0945 137/85     Pulse Rate 09/19/20 0945 70     Resp 09/19/20 0945 15     Temp 09/19/20 0945 98.2 F (36.8 C)     Temp Source 09/19/20 0945 Oral     SpO2 09/19/20 0945 97 %     Weight --      Height --      Head Circumference --      Peak Flow --      Pain Score 09/19/20 0949 3     Pain Loc --      Pain Edu? --      Excl. in GC? --    No data found.  Updated Vital Signs BP 137/85 (BP Location: Left Arm)   Pulse 70   Temp 98.2 F (36.8 C) (Oral)   Resp 15   SpO2 97%   Visual Acuity Right Eye  Distance:   Left Eye Distance:   Bilateral Distance:    Right Eye Near:   Left Eye Near:    Bilateral Near:     Physical Exam HENT:     Head: Normocephalic.  Eyes:     General: Lids are normal. Vision grossly intact.     Extraocular Movements: Extraocular movements intact.     Conjunctiva/sclera:     Right eye: Right conjunctiva is injected. Hemorrhage present.     Pupils: Pupils are equal, round, and reactive to light.     Right eye: Corneal abrasion and fluorescein uptake present.  Cardiovascular:     Rate and Rhythm: Normal rate and regular rhythm.  Pulmonary:     Effort: Pulmonary effort is normal.     Breath sounds: Normal breath sounds.  Skin:    Capillary Refill: Capillary refill takes less than 2 seconds.  Neurological:     Mental Status: She is alert.  Psychiatric:        Mood and Affect: Mood normal.        Thought Content: Thought content normal.        Judgment: Judgment normal.      UC Treatments / Results  Labs (all labs ordered are listed, but only abnormal results are displayed) Labs Reviewed - No data to display  EKG   Radiology No results found.  Procedures Procedures (including critical care time)  Medications Ordered in UC Medications - No data to display  Initial Impression / Assessment and Plan / UC Course  I have reviewed the triage vital signs and the nursing notes.  Pertinent labs & imaging results that were available during my care of the patient were reviewed by me and considered in my medical decision making (see chart for details).    Corneal abrasion involving the right eye.  Treatment with erythromycin instill 1 ribbon of eye ointment to the lower right eyelid once daily over the course of the next 5 days.  Avoid wearing contact lenses until treatment has been completed.  If pain remains after completion of therapy follow-up immediately with your ophthalmology specialist. Final Clinical Impressions(s) / UC Diagnoses   Final  diagnoses:  Abrasion of right cornea, initial encounter     Discharge Instructions     You may use the tetracaine twice daily as needed for pain. Start erythromycin eye ointment tonight at least 1 hour prior to bedtime apply one band on the lower eyelid and point-blank frequently to allow time for the medication to absorb into your eye.  Continue medication for a total of 5 days and avoid wearing contact lenses during treatment.  You may resume your contact lens wearing on day six 1 day after treatment has been completed. If you develop any worsening of eye redness or any increased eye pressure or any difficulty seeing please follow-up immediately with your eye specialist.    ED Prescriptions    Medication Sig Dispense Auth. Provider   erythromycin ophthalmic ointment Place a 1/2 inch ribbon of ointment into the lower eyelid. 3.5 g Bing Neighbors, FNP     PDMP not reviewed this encounter.   Bing Neighbors, FNP 09/24/20 [phone]

## 2020-09-19 NOTE — ED Triage Notes (Signed)
Slept in her contacts on Thursday & woke up to red itchy eyes on Friday  Pt c/o r eye hurting with redness noted - blurry vision intermittent No OTC meds

## 2021-01-18 ENCOUNTER — Other Ambulatory Visit: Payer: Self-pay | Admitting: Physician Assistant

## 2021-01-18 DIAGNOSIS — I10 Essential (primary) hypertension: Secondary | ICD-10-CM

## 2021-01-18 DIAGNOSIS — F332 Major depressive disorder, recurrent severe without psychotic features: Secondary | ICD-10-CM

## 2021-01-29 ENCOUNTER — Telehealth: Payer: Self-pay | Admitting: Physician Assistant

## 2021-01-29 ENCOUNTER — Ambulatory Visit: Payer: Self-pay | Admitting: Physician Assistant

## 2021-02-02 ENCOUNTER — Other Ambulatory Visit: Payer: Self-pay

## 2021-02-02 ENCOUNTER — Encounter: Payer: Self-pay | Admitting: Physician Assistant

## 2021-02-02 ENCOUNTER — Ambulatory Visit (INDEPENDENT_AMBULATORY_CARE_PROVIDER_SITE_OTHER): Payer: BC Managed Care – PPO | Admitting: Physician Assistant

## 2021-02-02 ENCOUNTER — Ambulatory Visit (INDEPENDENT_AMBULATORY_CARE_PROVIDER_SITE_OTHER): Payer: BC Managed Care – PPO

## 2021-02-02 VITALS — BP 154/126 | HR 71 | Temp 98.3°F | Resp 18 | Wt 189.0 lb

## 2021-02-02 DIAGNOSIS — F332 Major depressive disorder, recurrent severe without psychotic features: Secondary | ICD-10-CM

## 2021-02-02 DIAGNOSIS — R059 Cough, unspecified: Secondary | ICD-10-CM | POA: Diagnosis not present

## 2021-02-02 DIAGNOSIS — Z1211 Encounter for screening for malignant neoplasm of colon: Secondary | ICD-10-CM

## 2021-02-02 DIAGNOSIS — Z8616 Personal history of COVID-19: Secondary | ICD-10-CM

## 2021-02-02 DIAGNOSIS — I10 Essential (primary) hypertension: Secondary | ICD-10-CM

## 2021-02-02 MED ORDER — ALBUTEROL SULFATE HFA 108 (90 BASE) MCG/ACT IN AERS: 2.0000 | INHALATION_SPRAY | Freq: Four times a day (QID) | RESPIRATORY_TRACT | 0 refills | Status: DC | PRN

## 2021-02-02 MED ORDER — BENZONATATE 200 MG PO CAPS: 200.0000 mg | ORAL_CAPSULE | Freq: Two times a day (BID) | ORAL | 0 refills | Status: DC | PRN

## 2021-02-02 MED ORDER — LISINOPRIL-HYDROCHLOROTHIAZIDE 20-12.5 MG PO TABS: 1.0000 | ORAL_TABLET | Freq: Every day | ORAL | 1 refills | Status: DC

## 2021-02-02 MED ORDER — BUPROPION HCL ER (XL) 150 MG PO TB24: 150.0000 mg | ORAL_TABLET | Freq: Every day | ORAL | 1 refills | Status: DC

## 2021-02-02 MED ORDER — HYDROCOD POLST-CPM POLST ER 10-8 MG/5ML PO SUER: 5.0000 mL | Freq: Two times a day (BID) | ORAL | 0 refills | Status: DC | PRN

## 2021-02-02 MED ORDER — PREDNISONE 50 MG PO TABS: ORAL_TABLET | ORAL | 0 refills | Status: DC

## 2021-02-02 NOTE — Progress Notes (Signed)
Subjective:    Patient ID: Julie Martin, female    DOB: [birth year], 50 y.o.   MRN: [id number]  HPI Pt is a 49 yo female who presents to the clinic with cough for the last 2 weeks. She had covid at beginning of June and did well. Her cough started about 10 days into covid. Very little production of cough. Cough is constant and dry. She feels like she cannot stop coughing. It does keep her up and night and hard to go to sleep. Hx of asthma as a child.  No fever, chills, body aches. She is pretty fatigued but no SOB, leg cramping, swelling, tenderness. She is taking coricidin which helps some.   Needs lisinopril/HCTZ refilled. No CP, palpitations, headaches, or vision changes. Not checking BP at home.   Mood is good. No concerns or complaints. No SI/HC. On wellbutrin.   .. Active Ambulatory Problems    Diagnosis Date Noted   Greater trochanteric bursitis of right hip 02/26/2014   Hypothyroidism 02/26/2014   Depression 03/26/2014   Anxiety 03/26/2014   Sacroiliac joint dysfunction of left side 06/12/2015   Obesity 01/18/2016   Essential hypertension, benign 03/16/2016   Trouble in sleeping 06/29/2016   Orthostatic hypotension 11/02/2016   Murmur 03/23/2017   Lumbago 06/27/2017   Resolved Ambulatory Problems    Diagnosis Date Noted   Uterine polyp 02/26/2014   DUB (dysfunctional uterine bleeding) 02/26/2014   Elevated blood pressure 06/12/2015   Concussion without loss of consciousness 04/05/2016   Past Medical History:  Diagnosis Date   Abnormal cervical Papanicolaou smear        Review of Systems  All other systems reviewed and are negative.     Objective:   Physical Exam Vitals reviewed.  Constitutional:      Appearance: Normal appearance.  HENT:     Head: Normocephalic.     Right Ear: Tympanic membrane normal.     Left Ear: Tympanic membrane normal.     Nose: Nose normal.     Mouth/Throat:     Mouth: Mucous membranes are moist.  Eyes:      Conjunctiva/sclera: Conjunctivae normal.  Cardiovascular:     Rate and Rhythm: Normal rate and regular rhythm.  Pulmonary:     Effort: Pulmonary effort is normal.     Comments: Every inspiration pt has dry cough induced.  No rhonchi or wheezing.  Musculoskeletal:     Right lower leg: No edema.     Left lower leg: No edema.     Comments: No calf tenderness, warmth, redness, swelling.   Lymphadenopathy:     Cervical: No cervical adenopathy.  Neurological:     General: No focal deficit present.     Mental Status: She is alert and oriented to person, place, and time.  Psychiatric:        Mood and Affect: Mood normal.          Assessment & Plan:  Marland KitchenMarland KitchenRichard was seen today for cough.  Diagnoses and all orders for this visit:  Cough -     predniSONE (DELTASONE) 50 MG tablet; Take one tablet for 5 days. -     benzonatate (TESSALON) 200 MG capsule; Take 1 capsule (200 mg total) by mouth 2 (two) times daily as needed for cough. -     albuterol (VENTOLIN HFA) 108 (90 Base) MCG/ACT inhaler; Inhale 2 puffs into the lungs every 6 (six) hours as needed. -     chlorpheniramine-HYDROcodone (TUSSIONEX) 10-8 MG/5ML SUER; Take  5 mLs by mouth every 12 (twelve) hours as needed. -     CBC with Differential/Platelet -     DG Chest 2 View; Future  History of COVID-19 -     DG Chest 2 View; Future  Essential hypertension, benign -     lisinopril-hydrochlorothiazide (ZESTORETIC) 20-12.5 MG tablet; Take 1 tablet by mouth daily. -     COMPLETE METABOLIC PANEL WITH GFR  Severe episode of recurrent major depressive disorder, without psychotic features (HCC) -     buPROPion (WELLBUTRIN XL) 150 MG 24 hr tablet; Take 1 tablet (150 mg total) by mouth daily.  Colon cancer screening -     Ambulatory referral to Gastroenterology  Appears post viral. No worrisome signs of DVT/PE/pneumonia. Will get CXR.  Start prednisone for 5 days.  Albuterol to open airways every 4-6 hours.  Tessalon pearls for cough  during the day with cough drops and drinking sips of water.  Tussinonex cough syrup at bedtime.  Follow up as needed or if symptoms worsen.  Red flag symptoms discussed.   BP not to goal likely due to cough. Monitor at home.  Cmp ordered.   Refilled wellbutrin for mood.

## 2021-02-02 NOTE — Patient Instructions (Signed)

## 2021-02-02 NOTE — Progress Notes (Signed)
Treatment plan stays the same. No pneumonia.

## 2021-02-03 LAB — CBC WITH DIFFERENTIAL/PLATELET
Absolute Monocytes: 401 cells/uL (ref 200–950)
Basophils Absolute: 27 cells/uL (ref 0–200)
Basophils Relative: 0.4 %
Eosinophils Absolute: 129 cells/uL (ref 15–500)
Eosinophils Relative: 1.9 %
HCT: 42.7 % (ref 35.0–45.0)
Hemoglobin: 14.3 g/dL (ref 11.7–15.5)
Lymphs Abs: 1843 cells/uL (ref 850–3900)
MCH: 29.4 pg (ref 27.0–33.0)
MCHC: 33.5 g/dL (ref 32.0–36.0)
MCV: 87.7 fL (ref 80.0–100.0)
MPV: 12.1 fL (ref 7.5–12.5)
Monocytes Relative: 5.9 %
Neutro Abs: 4400 cells/uL (ref 1500–7800)
Neutrophils Relative %: 64.7 %
Platelets: 233 10*3/uL (ref 140–400)
RBC: 4.87 10*6/uL (ref 3.80–5.10)
RDW: 12.3 % (ref 11.0–15.0)
Total Lymphocyte: 27.1 %
WBC: 6.8 10*3/uL (ref 3.8–10.8)

## 2021-02-03 LAB — COMPLETE METABOLIC PANEL WITH GFR
AG Ratio: 1.5 (calc) (ref 1.0–2.5)
ALT: 10 U/L (ref 6–29)
AST: 17 U/L (ref 10–35)
Albumin: 4.1 g/dL (ref 3.6–5.1)
Alkaline phosphatase (APISO): 82 U/L (ref 31–125)
BUN: 16 mg/dL (ref 7–25)
CO2: 26 mmol/L (ref 20–32)
Calcium: 9.3 mg/dL (ref 8.6–10.2)
Chloride: 104 mmol/L (ref 98–110)
Creat: 0.8 mg/dL (ref 0.50–1.10)
GFR, Est African American: 101 mL/min/{1.73_m2} (ref >=60)
GFR, Est Non African American: 87 mL/min/{1.73_m2} (ref >=60)
Globulin: 2.8 g/dL (calc) (ref 1.9–3.7)
Glucose, Bld: 83 mg/dL (ref 65–99)
Potassium: 4.3 mmol/L (ref 3.5–5.3)
Sodium: 140 mmol/L (ref 135–146)
Total Bilirubin: 0.3 mg/dL (ref 0.2–1.2)
Total Protein: 6.9 g/dL (ref 6.1–8.1)

## 2021-02-03 NOTE — Progress Notes (Signed)
[name],   Your hemoglobin is great.  WBC is great.  Kidney, liver, glucose look fabulous.

## 2021-02-16 ENCOUNTER — Encounter: Payer: Self-pay | Admitting: Gastroenterology

## 2021-03-01 ENCOUNTER — Other Ambulatory Visit: Payer: Self-pay

## 2021-03-01 ENCOUNTER — Ambulatory Visit (AMBULATORY_SURGERY_CENTER): Payer: BC Managed Care – PPO

## 2021-03-01 VITALS — Ht 63.0 in | Wt 182.0 lb

## 2021-03-01 DIAGNOSIS — Z1211 Encounter for screening for malignant neoplasm of colon: Secondary | ICD-10-CM

## 2021-03-01 MED ORDER — PLENVU 140 G PO SOLR: 1.0000 | ORAL | 0 refills | Status: DC

## 2021-03-01 NOTE — Progress Notes (Signed)
Patient's pre-visit was done today over the phone with the patient  Name,DOB and address verified. Patient denies any allergies to Eggs and Soy. Patient denies any problems with anesthesia/sedation. Patient denies taking diet pills or blood thinners. No home Oxygen. Packet of Prep instructions mailed to patient including a copy of a consent form-pt is aware. Patient understands to call us back with any questions or concerns. Patient is aware of our care-partner policy and Covid-19 safety protocol.   EMMI education assigned to the patient for the procedure, sent to MyChart.   The patient is COVID-19 vaccinated, per patient.  

## 2021-03-12 ENCOUNTER — Encounter: Payer: Self-pay | Admitting: Gastroenterology

## 2021-03-12 ENCOUNTER — Ambulatory Visit (AMBULATORY_SURGERY_CENTER): Payer: BC Managed Care – PPO | Admitting: Gastroenterology

## 2021-03-12 ENCOUNTER — Other Ambulatory Visit: Payer: Self-pay

## 2021-03-12 VITALS — BP 133/96 | HR 65 | Temp 98.2°F | Resp 16 | Ht 63.0 in | Wt 182.0 lb

## 2021-03-12 DIAGNOSIS — Z1211 Encounter for screening for malignant neoplasm of colon: Secondary | ICD-10-CM | POA: Diagnosis not present

## 2021-03-12 MED ORDER — SODIUM CHLORIDE 0.9 % IV SOLN: 500.0000 mL | Freq: Once | INTRAVENOUS | Status: DC

## 2021-03-12 NOTE — Progress Notes (Signed)
No problems noted in the recovery room. maw 

## 2021-03-12 NOTE — Progress Notes (Signed)
[facility] History and Physical   Primary Care Physician:  Nolene Ebbs   Reason for Procedure:   screening  Plan:    colon     HPI: Julie Martin is a 50 y.o. female    Past Medical History:  Diagnosis Date   Abnormal cervical Papanicolaou smear    Anxiety    Depression    Essential hypertension, benign 03/16/2016   Heart murmur    Pt states has a murmur-a common type that does not require treatment.   Hypothyroidism 02/26/2014   Resolved after birth of son 9 years ago.     Obesity     Past Surgical History:  Procedure Laterality Date   COLPOSCOPY     DILATION AND CURETTAGE OF UTERUS     ENDOMETRIAL ABLATION     SALIVARY GLAND SURGERY Left    TYMPANOSTOMY Bilateral 1998    Prior to Admission medications   Medication Sig Start Date End Date Taking? Authorizing Provider  buPROPion (WELLBUTRIN XL) 150 MG 24 hr tablet Take 1 tablet (150 mg total) by mouth daily. 02/02/21  Yes Breeback, Jade L, PA-C  lisinopril-hydrochlorothiazide (ZESTORETIC) 20-12.5 MG tablet Take 1 tablet by mouth daily. 02/02/21  Yes Breeback, Jade L, PA-C  Multiple Vitamin (MULTIVITAMIN) tablet Take 1 tablet by mouth daily.   Yes [provider]  albuterol (VENTOLIN HFA) 108 (90 Base) MCG/ACT inhaler Inhale 2 puffs into the lungs every 6 (six) hours as needed. Patient not taking: No sig reported 02/02/21   Tandy Gaw L, PA-C  benzonatate (TESSALON) 200 MG capsule Take 1 capsule (200 mg total) by mouth 2 (two) times daily as needed for cough. Patient not taking: No sig reported 02/02/21   Tandy Gaw L, PA-C  chlorpheniramine-HYDROcodone (TUSSIONEX) 10-8 MG/5ML SUER Take 5 mLs by mouth every 12 (twelve) hours as needed. Patient not taking: No sig reported 02/02/21   Tandy Gaw L, PA-C  Omega-3 Fatty Acids (OMEGA 3 PO) Take by mouth. Patient not taking: No sig reported    [provider]  predniSONE (DELTASONE) 50 MG tablet Take one tablet for 5  days. Patient not taking: No sig reported 02/02/21   Jomarie Longs, PA-C    Current Outpatient Medications  Medication Sig Dispense Refill   buPROPion (WELLBUTRIN XL) 150 MG 24 hr tablet Take 1 tablet (150 mg total) by mouth daily. 90 tablet 1   lisinopril-hydrochlorothiazide (ZESTORETIC) 20-12.5 MG tablet Take 1 tablet by mouth daily. 90 tablet 1   Multiple Vitamin (MULTIVITAMIN) tablet Take 1 tablet by mouth daily.     albuterol (VENTOLIN HFA) 108 (90 Base) MCG/ACT inhaler Inhale 2 puffs into the lungs every 6 (six) hours as needed. (Patient not taking: No sig reported) 6.7 g 0   benzonatate (TESSALON) 200 MG capsule Take 1 capsule (200 mg total) by mouth 2 (two) times daily as needed for cough. (Patient not taking: No sig reported) 20 capsule 0   chlorpheniramine-HYDROcodone (TUSSIONEX) 10-8 MG/5ML SUER Take 5 mLs by mouth every 12 (twelve) hours as needed. (Patient not taking: No sig reported) 70 mL 0   Omega-3 Fatty Acids (OMEGA 3 PO) Take by mouth. (Patient not taking: No sig reported)     predniSONE (DELTASONE) 50 MG tablet Take one tablet for 5 days. (Patient not taking: No sig reported) 5 tablet 0   Current Facility-Administered Medications  Medication Dose Route Frequency Provider Last Rate Last Admin   0.9 %  sodium chloride infusion  500 mL Intravenous  Once Lynann Bologna, MD        Allergies as of 03/12/2021 - Review Complete 03/12/2021  Allergen Reaction Noted   Betadine [povidone iodine]  04/01/2014   Doxycycline  04/01/2014   Sulfonamide derivatives  04/06/2010   Sulfa antibiotics Rash 02/26/2014    Family History  Problem Relation Age of Onset   Hypertension Mother    High Cholesterol Mother    Diabetes Mother    Hypertension Father    High Cholesterol Father    Diabetes Father    Thyroid cancer Father    Hypertension Brother    Diabetes Brother    Healthy Brother    Healthy Brother    Alzheimer's disease Paternal Grandfather    Prostate cancer Paternal  Grandfather    Colon cancer Neg Hx    Colon polyps Neg Hx    Esophageal cancer Neg Hx    Rectal cancer Neg Hx    Stomach cancer Neg Hx     Social History   Socioeconomic History   Marital status: Divorced    Spouse name: Not on file   Number of children: Not on file   Years of education: Not on file   Highest education level: Not on file  Occupational History   Occupation: Runner, broadcasting/film/video  Tobacco Use   Smoking status: Former    Types: Cigarettes   Smokeless tobacco: Never  Vaping Use   Vaping Use: Never used  Substance and Sexual Activity   Alcohol use: Yes    Comment: occassionally   Drug use: Never   Sexual activity: Yes    Partners: Female    Birth control/protection: None  Other Topics Concern   Not on file  Social History Narrative   Not on file   Social Determinants of Health   Financial Resource Strain: Not on file  Food Insecurity: Not on file  Transportation Needs: Not on file  Physical Activity: Not on file  Stress: Not on file  Social Connections: Not on file  Intimate Partner Violence: Not on file    Review of Systems:  All other review of systems negative except as mentioned in the HPI.  Physical Exam: Vital signs in last 24 hours: @VSRANGES @   General:   Alert,  Well-developed, well-nourished, pleasant and cooperative in NAD Lungs:  Clear throughout to auscultation.   Heart:  Regular rate and rhythm; no murmurs, clicks, rubs,  or gallops. Abdomen:  Soft, nontender and nondistended. Normal bowel sounds.   Neuro/Psych:  Alert and cooperative. Normal mood and affect. A and O x 3    No significant changes were identified.  The patient continues to be an appropriate candidate for the planned procedure and anesthesia.   , MD. [facility] 03/12/2021 1:50 PM@

## 2021-03-12 NOTE — Patient Instructions (Addendum)
Handout was given to your care partner on Hemorrhoids. You may resume your current medications today. Repeat screening colonoscopy in 10 years. Please call if any questions or concerns.      YOU HAD AN ENDOSCOPIC PROCEDURE TODAY AT THE [facility]:   Refer to the procedure report that was given to you for any specific questions about what was found during the examination.  If the procedure report does not answer your questions, please call your gastroenterologist to clarify.  If you requested that your care partner not be given the details of your procedure findings, then the procedure report has been included in a sealed envelope for you to review at your convenience later.  YOU SHOULD EXPECT: Some feelings of bloating in the abdomen. Passage of more gas than usual.  Walking can help get rid of the air that was put into your GI tract during the procedure and reduce the bloating. If you had a lower endoscopy (such as a colonoscopy or flexible sigmoidoscopy) you may notice spotting of blood in your stool or on the toilet paper. If you underwent a bowel prep for your procedure, you may not have a normal bowel movement for a few days.  Please Note:  You might notice some irritation and congestion in your nose or some drainage.  This is from the oxygen used during your procedure.  There is no need for concern and it should clear up in a day or so.  SYMPTOMS TO REPORT IMMEDIATELY:  Following lower endoscopy (colonoscopy or flexible sigmoidoscopy):  Excessive amounts of blood in the stool  Significant tenderness or worsening of abdominal pains  Swelling of the abdomen that is new, acute  Fever of 100F or higher  For urgent or emergent issues, a gastroenterologist can be reached at any hour by calling [phone]. Do not use MyChart messaging for urgent concerns.    DIET:  We do recommend a small meal at first, but then you may proceed to your regular diet.  Drink plenty of fluids  but you should avoid alcoholic beverages for 24 hours.  ACTIVITY:  You should plan to take it easy for the rest of today and you should NOT DRIVE or use heavy machinery until tomorrow (because of the sedation medicines used during the test).    FOLLOW UP: Our staff will call the number listed on your records 48-72 hours following your procedure to check on you and address any questions or concerns that you may have regarding the information given to you following your procedure. If we do not reach you, we will leave a message.  We will attempt to reach you two times.  During this call, we will ask if you have developed any symptoms of COVID 19. If you develop any symptoms (ie: fever, flu-like symptoms, shortness of breath, cough etc.) before then, please call [phone].  If you test positive for Covid 19 in the 2 weeks post procedure, please call and report this information to us.    If any biopsies were taken you will be contacted by phone or by letter within the next 1-3 weeks.  Please call us at [phone] if you have not heard about the biopsies in 3 weeks.    SIGNATURES/CONFIDENTIALITY: You and/or your care partner have signed paperwork which will be entered into your electronic medical record.  These signatures attest to the fact that that the information above on your After Visit Summary has been reviewed and is understood.  Full   of the confidentiality of this discharge information lies with you and/or your care-partner.

## 2021-03-12 NOTE — Op Note (Signed)
[facility] Patient Name: Julie Martin Procedure Date: 03/12/2021 1:35 PM MRN: [id number] Endoscopist: Lynann Bologna , MD Age: 50 Referring MD:  Date of Birth: [birth year] Gender: Female Account #: [id number] Procedure:                Colonoscopy Indications:              Screening for colorectal malignant neoplasm Medicines:                Monitored Anesthesia Care Procedure:                Pre-Anesthesia Assessment:                           - Prior to the procedure, a History and Physical                            was performed, and patient medications and                            allergies were reviewed. The patient's tolerance of                            previous anesthesia was also reviewed. The risks                            and benefits of the procedure and the sedation                            options and risks were discussed with the patient.                            All questions were answered, and informed consent                            was obtained. Prior Anticoagulants: The patient has                            taken no previous anticoagulant or antiplatelet                            agents. ASA Grade Assessment: II - A patient with                            mild systemic disease. After reviewing the risks                            and benefits, the patient was deemed in                            satisfactory condition to undergo the procedure.                           After obtaining informed consent, the colonoscope  was passed under direct vision. Throughout the                            procedure, the patient's blood pressure, pulse, and                            oxygen saturations were monitored continuously. The                            Olympus PCF-H190DL (#[id number]) Colonoscope was                            introduced through the anus and advanced to the 2                            cm into the ileum. The  colonoscopy was performed                            without difficulty. The patient tolerated the                            procedure well. The quality of the bowel                            preparation was good. The terminal ileum, ileocecal                            valve, appendiceal orifice, and rectum were                            photographed. Scope In: 1:55:25 PM Scope Out: 2:09:22 PM Scope Withdrawal Time: 0 hours 9 minutes 11 seconds  Total Procedure Duration: 0 hours 13 minutes 57 seconds  Findings:                 The colon (entire examined portion) appeared normal.                           Non-bleeding internal hemorrhoids were found during                            retroflexion and during perianal exam. The                            hemorrhoids were small.                           The terminal ileum appeared normal.                           The exam was otherwise without abnormality on                            direct and retroflexion views. Complications:            No immediate complications. Estimated Blood Loss:  Estimated blood loss: none. Impression:               - The entire examined colon is normal.                           - Non-bleeding internal hemorrhoids.                           - The examined portion of the ileum was normal.                           - The examination was otherwise normal on direct                            and retroflexion views.                           - No specimens collected. Recommendation:           - Patient has a contact number available for                            emergencies. The signs and symptoms of potential                            delayed complications were discussed with the                            patient. Return to normal activities tomorrow.                            Written discharge instructions were provided to the                            patient.                           - Resume  previous diet.                           - Continue present medications.                           - Repeat colonoscopy in 10 years for screening                            purposes. Earlier, if clinically indicated.                           - The findings and recommendations were discussed                            with pt family. Lynann Bologna, MD 03/12/2021 2:13:58 PM This report has been signed electronically.

## 2021-03-12 NOTE — Progress Notes (Signed)
Pt's states no medical or surgical changes since previsit or office visit. 

## 2021-03-12 NOTE — Progress Notes (Signed)
pt tolerated well. VSS. awake and to recovery. Report given to RN.  

## 2021-03-12 NOTE — Progress Notes (Signed)
C.W. vital signs. 

## 2021-03-16 ENCOUNTER — Telehealth: Payer: Self-pay | Admitting: *Deleted

## 2021-03-16 NOTE — Telephone Encounter (Signed)
  Follow up Call-  Call back number 03/12/2021  Post procedure Call Back phone  # [phone]  Permission to leave phone message Yes  Some recent data might be hidden     Patient questions:  Do you have a fever, pain , or abdominal swelling? No. Pain Score  0 *  Have you tolerated food without any problems? Yes.    Have you been able to return to your normal activities? Yes.    Do you have any questions about your discharge instructions: Diet   No. Medications  No. Follow up visit  No.  Do you have questions or concerns about your Care? No.  Actions: * If pain score is 4 or above: No action needed, pain <4.  Have you developed a fever since your procedure? no  2.   Have you had an respiratory symptoms (SOB or cough) since your procedure? no  3.   Have you tested positive for COVID 19 since your procedure no  4.   Have you had any family members/close contacts diagnosed with the COVID 19 since your procedure?  no   If yes to any of these questions please route to Laverna Peace, RN and Karlton Lemon, RN

## 2021-04-22 ENCOUNTER — Other Ambulatory Visit: Payer: Self-pay | Admitting: Physician Assistant

## 2021-04-22 DIAGNOSIS — R059 Cough, unspecified: Secondary | ICD-10-CM

## 2021-04-22 MED ORDER — BENZONATATE 200 MG PO CAPS: 200.0000 mg | ORAL_CAPSULE | Freq: Two times a day (BID) | ORAL | 0 refills | Status: DC | PRN

## 2021-07-29 ENCOUNTER — Telehealth: Payer: BC Managed Care – PPO | Admitting: Physician Assistant

## 2021-07-29 DIAGNOSIS — R6889 Other general symptoms and signs: Secondary | ICD-10-CM

## 2021-07-29 MED ORDER — OSELTAMIVIR PHOSPHATE 75 MG PO CAPS: 75.0000 mg | ORAL_CAPSULE | Freq: Two times a day (BID) | ORAL | 0 refills | Status: DC

## 2021-07-29 NOTE — Progress Notes (Signed)
I have spent 5 minutes in review of e-visit questionnaire, review and updating patient chart, medical decision making and response to patient.   Makynna Manocchio Cody Pati Thinnes, PA-C    

## 2021-07-29 NOTE — Progress Notes (Signed)

## 2021-08-04 ENCOUNTER — Other Ambulatory Visit: Payer: Self-pay

## 2021-08-04 ENCOUNTER — Encounter: Payer: Self-pay | Admitting: Physician Assistant

## 2021-08-04 ENCOUNTER — Ambulatory Visit (INDEPENDENT_AMBULATORY_CARE_PROVIDER_SITE_OTHER): Payer: BC Managed Care – PPO | Admitting: Physician Assistant

## 2021-08-04 DIAGNOSIS — Z13220 Encounter for screening for lipoid disorders: Secondary | ICD-10-CM | POA: Diagnosis not present

## 2021-08-04 DIAGNOSIS — I10 Essential (primary) hypertension: Secondary | ICD-10-CM | POA: Diagnosis not present

## 2021-08-04 DIAGNOSIS — R058 Other specified cough: Secondary | ICD-10-CM | POA: Diagnosis not present

## 2021-08-04 DIAGNOSIS — E039 Hypothyroidism, unspecified: Secondary | ICD-10-CM

## 2021-08-04 DIAGNOSIS — F332 Major depressive disorder, recurrent severe without psychotic features: Secondary | ICD-10-CM

## 2021-08-04 DIAGNOSIS — Z79899 Other long term (current) drug therapy: Secondary | ICD-10-CM

## 2021-08-04 DIAGNOSIS — Z1231 Encounter for screening mammogram for malignant neoplasm of breast: Secondary | ICD-10-CM

## 2021-08-04 MED ORDER — BUPROPION HCL ER (XL) 150 MG PO TB24: 150.0000 mg | ORAL_TABLET | Freq: Every day | ORAL | 1 refills | Status: DC

## 2021-08-04 MED ORDER — BENZONATATE 200 MG PO CAPS: 200.0000 mg | ORAL_CAPSULE | Freq: Three times a day (TID) | ORAL | 0 refills | Status: DC | PRN

## 2021-08-04 MED ORDER — LISINOPRIL-HYDROCHLOROTHIAZIDE 20-12.5 MG PO TABS: 1.0000 | ORAL_TABLET | Freq: Every day | ORAL | 1 refills | Status: DC

## 2021-08-04 MED ORDER — METHYLPREDNISOLONE 4 MG PO TBPK: ORAL_TABLET | ORAL | 0 refills | Status: DC

## 2021-08-04 NOTE — Progress Notes (Signed)
Subjective:     Patient ID: Julie Martin, female   DOB: [birth year], 51 y.o.   MRN: [id number]  HPI 51 YO female presents to the clinic complaining of a cough. Patient was seen and treated for flu-like symptoms virtually on 12/29. Patient was treated with tamiflu and used tylenol for fever. Patient reports her flu-like symptoms of fever, body aches, fatigue have subsided. She currently complains of a dry cough that started over the weekend. Patient reports tessalon pearls have helped her cough in the past. Pt is unvaccinated for the flu. She denies any SOB, fever, chills, body aches.   She request refills for BP and mood medications. She is doing well with no concerns. She denies any CP, palpitations, headaches, vision changes. She is taking her medication daily. She denies any SI/HC. She is not checking her BP at home.   .. 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     Objective:   Physical Exam Vitals reviewed.  Constitutional:      Appearance: Normal appearance. She is obese.  HENT:     Head: Normocephalic.     Right Ear: Tympanic membrane normal.     Left Ear: Tympanic membrane normal.     Nose: Nose normal.     Mouth/Throat:     Mouth: Mucous membranes  are moist.  Eyes:     Extraocular Movements: Extraocular movements intact.     Conjunctiva/sclera: Conjunctivae normal.     Pupils: Pupils are equal, round, and reactive to light.  Cardiovascular:     Rate and Rhythm: Normal rate and regular rhythm.     Pulses: Normal pulses.  Pulmonary:     Effort: Pulmonary effort is normal.     Breath sounds: Normal breath sounds.     Comments: Dry cough with deep breathing Musculoskeletal:     Cervical back: Normal range of motion and neck supple.  Lymphadenopathy:     Cervical: No cervical adenopathy.  Neurological:     General: No focal deficit present.     Mental Status: She is alert and oriented to person, place, and time.  Psychiatric:        Mood and Affect: Mood normal.      .. Depression screen [facility] 2/9 08/04/2021 07/22/2020 08/22/2018 04/23/2018 06/09/2017  Decreased Interest 2 1 0 1 1  Down, Depressed, Hopeless 2 2 2 1 1   PHQ - 2 Score 4 3 2 2 2   Altered sleeping 0 2 2 2 2   Tired, decreased energy 0 2 2 1 1   Change in appetite 0 2 1 2 1   Feeling bad or failure about yourself  0 1 2 2 1   Trouble concentrating 0 2 1 1 1   Moving slowly or fidgety/restless 0 1 1  1 1  Suicidal thoughts 0 1 0 1 0  PHQ-9 Score 4 14 11 12 9   Difficult doing work/chores Not difficult at all Not difficult at all Somewhat difficult Somewhat difficult -   .. GAD 7 : Generalized Anxiety Score 07/22/2020 08/22/2018 04/23/2018 06/09/2017  Nervous, Anxious, on Edge 2 2 1 2   Control/stop worrying 2 2 1 2   Worry too much - different things 2 2 1 2   Trouble relaxing 2 2 2 1   Restless 1 1 1 1   Easily annoyed or irritable 1 2 2  0  Afraid - awful might happen 0 0 1 1  Total GAD 7 Score 10 11 9 9   Anxiety Difficulty Not difficult at all Somewhat difficult Somewhat difficult -     Assessment:     13/04/2017 Chari was seen today for cough.  Diagnoses and all orders for this visit:  Post-viral cough syndrome -     benzonatate (TESSALON) 200 MG capsule; Take 1 capsule  (200 mg total) by mouth 3 (three) times daily as needed for cough. -     methylPREDNISolone (MEDROL DOSEPAK) 4 MG TBPK tablet; Take as directed by package insert.  Essential hypertension, benign -     COMPLETE METABOLIC PANEL WITH GFR -     lisinopril-hydrochlorothiazide (ZESTORETIC) 20-12.5 MG tablet; Take 1 tablet by mouth daily.  Hypothyroidism, unspecified type -     TSH  Screening for lipid disorders -     Lipid Panel w/reflex Direct LDL  Medication management -     TSH -     Lipid Panel w/reflex Direct LDL -     COMPLETE METABOLIC PANEL WITH GFR -     CBC with Differential/Platelet  Severe episode of recurrent major depressive disorder, without psychotic features (HCC) -     buPROPion (WELLBUTRIN XL) 150 MG 24 hr tablet; Take 1 tablet (150 mg total) by mouth daily.  Visit for screening mammogram -     MM 3D SCREEN BREAST BILATERAL       Plan:     Post viral cough treated with tessalon pearls and cough drops if needed start medrol dose pak. Consider trial of albuterol 2 puffs as needed for cough and before exercise.  CMP ordered. BP not to goal but coughing.  Medications refilled for 6 months.

## 2021-08-05 LAB — CBC WITH DIFFERENTIAL/PLATELET
Absolute Monocytes: 462 cells/uL (ref 200–950)
Basophils Absolute: 42 cells/uL (ref 0–200)
Basophils Relative: 0.6 %
Eosinophils Absolute: 70 cells/uL (ref 15–500)
Eosinophils Relative: 1 %
HCT: 43.6 % (ref 35.0–45.0)
Hemoglobin: 14.7 g/dL (ref 11.7–15.5)
Lymphs Abs: 2702 cells/uL (ref 850–3900)
MCH: 28.9 pg (ref 27.0–33.0)
MCHC: 33.7 g/dL (ref 32.0–36.0)
MCV: 85.8 fL (ref 80.0–100.0)
MPV: 11.3 fL (ref 7.5–12.5)
Monocytes Relative: 6.6 %
Neutro Abs: 3724 cells/uL (ref 1500–7800)
Neutrophils Relative %: 53.2 %
Platelets: 251 10*3/uL (ref 140–400)
RBC: 5.08 10*6/uL (ref 3.80–5.10)
RDW: 12.4 % (ref 11.0–15.0)
Total Lymphocyte: 38.6 %
WBC: 7 10*3/uL (ref 3.8–10.8)

## 2021-08-05 LAB — COMPLETE METABOLIC PANEL WITH GFR
AG Ratio: 1.4 (calc) (ref 1.0–2.5)
ALT: 23 U/L (ref 6–29)
AST: 21 U/L (ref 10–35)
Albumin: 4.3 g/dL (ref 3.6–5.1)
Alkaline phosphatase (APISO): 72 U/L (ref 31–125)
BUN: 15 mg/dL (ref 7–25)
CO2: 24 mmol/L (ref 20–32)
Calcium: 9 mg/dL (ref 8.6–10.2)
Chloride: 105 mmol/L (ref 98–110)
Creat: 0.79 mg/dL (ref 0.50–0.99)
Globulin: 3.1 g/dL (calc) (ref 1.9–3.7)
Glucose, Bld: 74 mg/dL (ref 65–99)
Potassium: 4.5 mmol/L (ref 3.5–5.3)
Sodium: 139 mmol/L (ref 135–146)
Total Bilirubin: 0.4 mg/dL (ref 0.2–1.2)
Total Protein: 7.4 g/dL (ref 6.1–8.1)
eGFR: 92 mL/min/{1.73_m2} (ref >=60)

## 2021-08-05 LAB — TSH: TSH: 2.33 mIU/L

## 2021-08-05 LAB — LIPID PANEL W/REFLEX DIRECT LDL
Cholesterol: 197 mg/dL (ref <200)
HDL: 52 mg/dL (ref >=50)
LDL Cholesterol (Calc): 113 mg/dL (calc) — ABNORMAL HIGH
Non-HDL Cholesterol (Calc): 145 mg/dL (calc) — ABNORMAL HIGH (ref <130)
Total CHOL/HDL Ratio: 3.8 (calc) (ref <5.0)
Triglycerides: 203 mg/dL — ABNORMAL HIGH (ref <150)

## 2021-08-06 NOTE — Progress Notes (Signed)
Julie Martin,   Thyroid looks great.  Hemoglobin great.  Kidney, liver, glucose look great.  TG are elevated quite a bit from last year and LDL up some too. Work on lower fats,sugars, carbs, fried, processed foods. Start fish oil 4000mg  daily. Exercise 30 minutes 4 times a week.

## 2021-08-19 ENCOUNTER — Other Ambulatory Visit: Payer: Self-pay

## 2021-08-19 ENCOUNTER — Ambulatory Visit (INDEPENDENT_AMBULATORY_CARE_PROVIDER_SITE_OTHER): Payer: BC Managed Care – PPO

## 2021-08-19 DIAGNOSIS — Z1231 Encounter for screening mammogram for malignant neoplasm of breast: Secondary | ICD-10-CM

## 2021-08-20 NOTE — Progress Notes (Signed)
Call patient with results.normal mammogram. Follow up in 1 year.

## 2021-10-22 ENCOUNTER — Encounter: Payer: Self-pay | Admitting: Neurology

## 2021-10-22 NOTE — Telephone Encounter (Signed)
Please advise 

## 2021-10-22 NOTE — Telephone Encounter (Signed)
Mychart message sent to patient.

## 2021-10-22 NOTE — Telephone Encounter (Signed)
Done

## 2022-03-31 IMAGING — MG MM DIGITAL SCREENING BILAT W/ TOMO AND CAD
8 series · 8 of 24 positions shown · non-contrast
Comparison: Previous exam(s).

CLINICAL DATA: Screening.

EXAM:
DIGITAL SCREENING BILATERAL MAMMOGRAM WITH TOMOSYNTHESIS AND CAD
TECHNIQUE: Bilateral screening digital craniocaudal and mediolateral oblique
mammograms were obtained. Bilateral screening digital breast
tomosynthesis was performed. The images were evaluated with
computer-aided detection.

[R CC synth-2D]
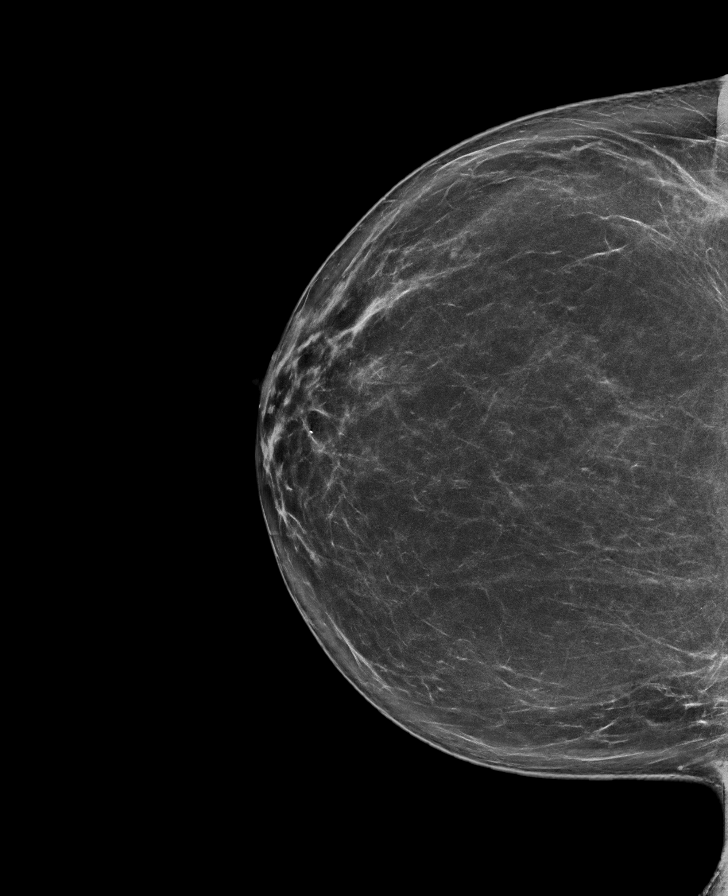

[L CC synth-2D]
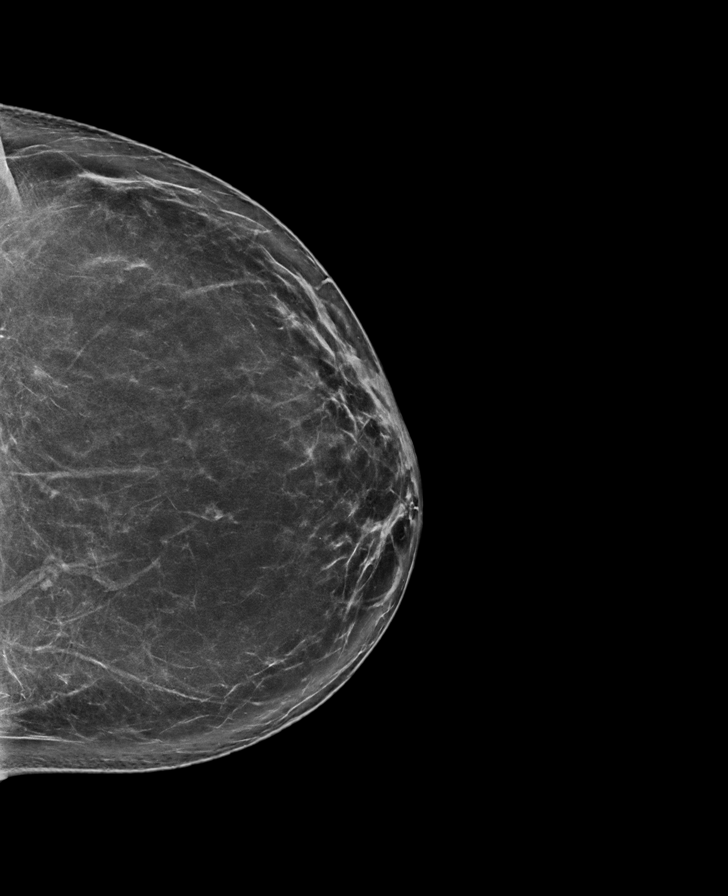

[L MLO synth-2D]
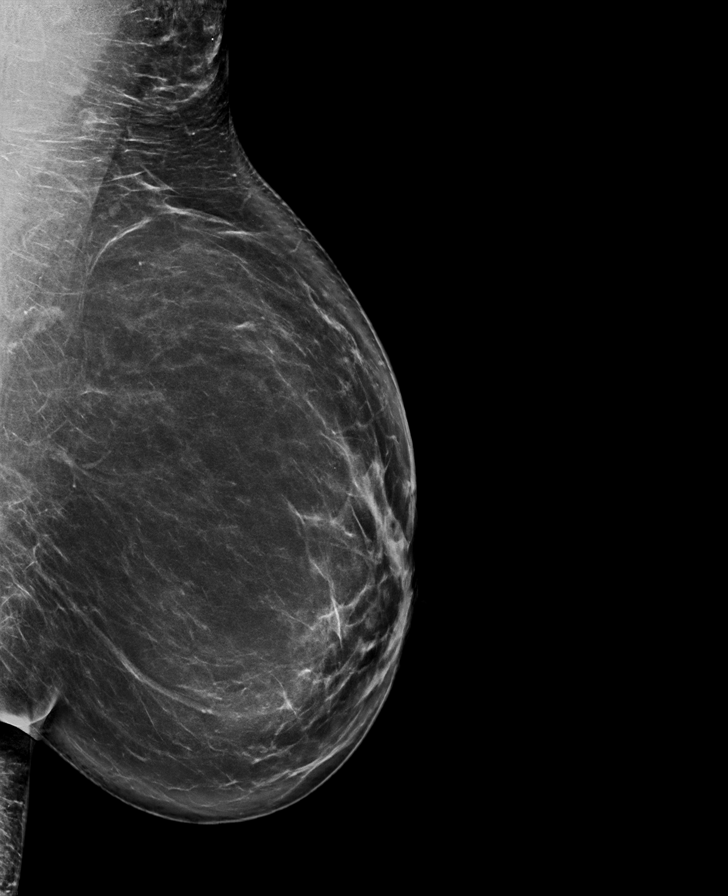

[R MLO synth-2D]
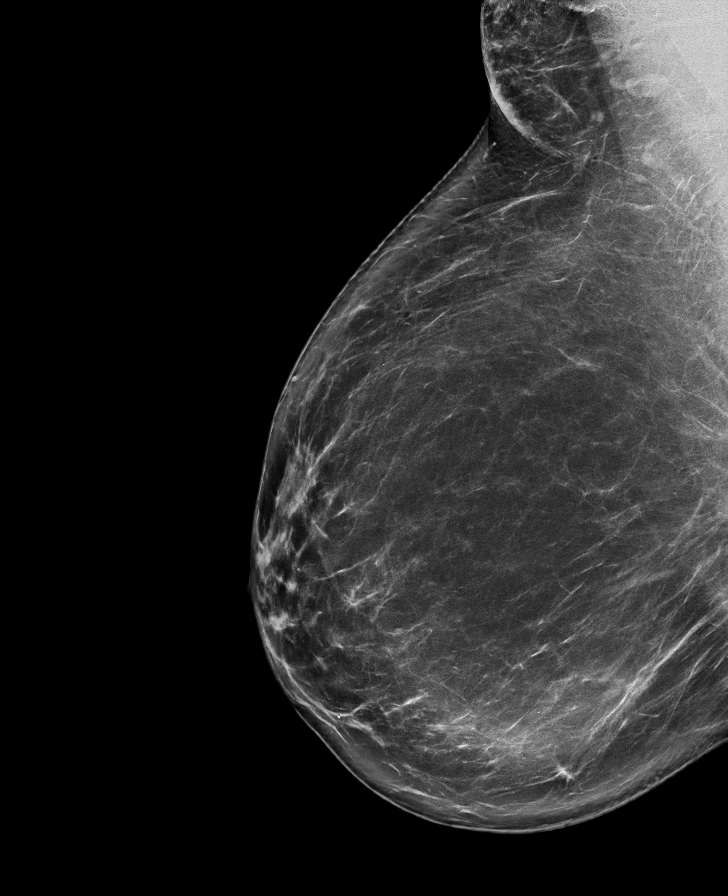

[L MLO tomo · tomo slice 49/96.0]
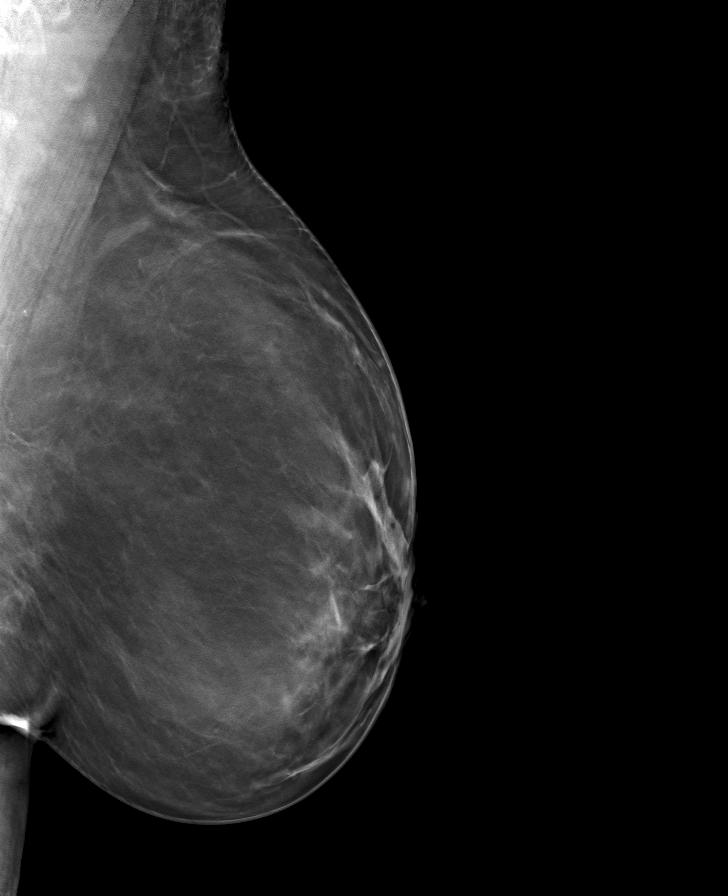

[L CC tomo · tomo slice 45/88.0]
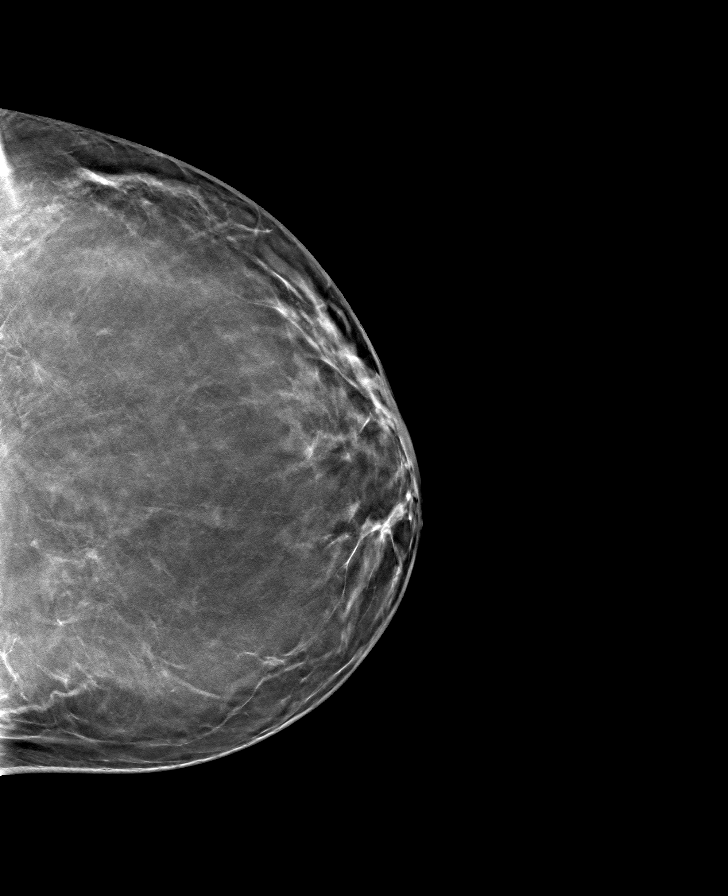

[R CC tomo · tomo slice 45/90.0]
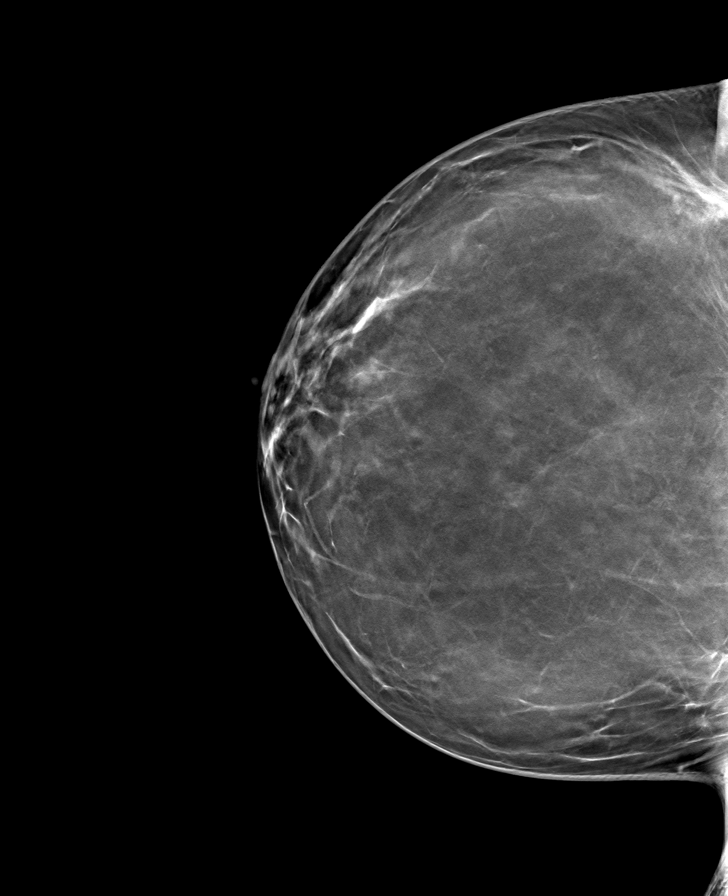

[R MLO tomo · tomo slice 49/98.0]
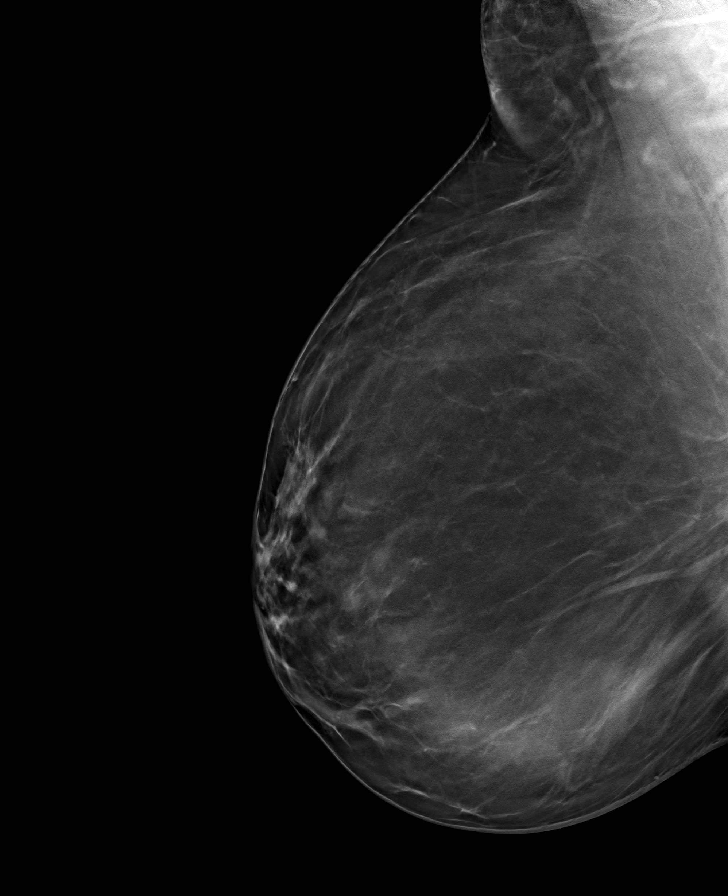

[8 of 24 positions shown; findings below may reference images not displayed]

ACR Breast Density Category b: There are scattered areas of
fibroglandular density.
FINDINGS: There are no findings suspicious for malignancy.
IMPRESSION: No mammographic evidence of malignancy. A result letter of this
screening mammogram will be mailed directly to the patient.

RECOMMENDATION:
Screening mammogram in one year. (Code:51-O-LD2)

BI-RADS CATEGORY  1: Negative.

## 2022-08-01 ENCOUNTER — Other Ambulatory Visit: Payer: Self-pay | Admitting: Physician Assistant

## 2022-08-01 DIAGNOSIS — F332 Major depressive disorder, recurrent severe without psychotic features: Secondary | ICD-10-CM

## 2022-08-01 DIAGNOSIS — I10 Essential (primary) hypertension: Secondary | ICD-10-CM

## 2022-10-26 ENCOUNTER — Encounter: Payer: Self-pay | Admitting: Physician Assistant

## 2022-10-26 ENCOUNTER — Ambulatory Visit: Payer: BC Managed Care – PPO | Admitting: Physician Assistant

## 2022-10-26 VITALS — BP 150/99 | HR 64 | Ht 63.0 in | Wt 182.0 lb

## 2022-10-26 DIAGNOSIS — I10 Essential (primary) hypertension: Secondary | ICD-10-CM | POA: Diagnosis not present

## 2022-10-26 DIAGNOSIS — F419 Anxiety disorder, unspecified: Secondary | ICD-10-CM | POA: Diagnosis not present

## 2022-10-26 DIAGNOSIS — Z79899 Other long term (current) drug therapy: Secondary | ICD-10-CM

## 2022-10-26 DIAGNOSIS — F332 Major depressive disorder, recurrent severe without psychotic features: Secondary | ICD-10-CM | POA: Diagnosis not present

## 2022-10-26 DIAGNOSIS — R059 Cough, unspecified: Secondary | ICD-10-CM

## 2022-10-26 DIAGNOSIS — E039 Hypothyroidism, unspecified: Secondary | ICD-10-CM | POA: Diagnosis not present

## 2022-10-26 DIAGNOSIS — F32A Depression, unspecified: Secondary | ICD-10-CM

## 2022-10-26 DIAGNOSIS — Z1231 Encounter for screening mammogram for malignant neoplasm of breast: Secondary | ICD-10-CM

## 2022-10-26 DIAGNOSIS — R053 Chronic cough: Secondary | ICD-10-CM

## 2022-10-26 DIAGNOSIS — J45990 Exercise induced bronchospasm: Secondary | ICD-10-CM

## 2022-10-26 DIAGNOSIS — Z13220 Encounter for screening for lipoid disorders: Secondary | ICD-10-CM

## 2022-10-26 MED ORDER — ALBUTEROL SULFATE HFA 108 (90 BASE) MCG/ACT IN AERS: 2.0000 | INHALATION_SPRAY | Freq: Four times a day (QID) | RESPIRATORY_TRACT | 0 refills | Status: DC | PRN

## 2022-10-26 MED ORDER — LOSARTAN POTASSIUM-HCTZ 100-25 MG PO TABS: 1.0000 | ORAL_TABLET | Freq: Every day | ORAL | 0 refills | Status: DC

## 2022-10-26 MED ORDER — BUPROPION HCL ER (XL) 150 MG PO TB24: 150.0000 mg | ORAL_TABLET | Freq: Every day | ORAL | 3 refills | Status: AC

## 2022-10-26 NOTE — Patient Instructions (Signed)
4-6 week follow up stress and cough Use albuterol 2 puffs 15 minutes before exercise Stop lisinopril/HCTZ and start losartan/HCTZ recheck BP in 2 weeks

## 2022-10-31 DIAGNOSIS — R053 Chronic cough: Secondary | ICD-10-CM | POA: Insufficient documentation

## 2022-10-31 DIAGNOSIS — J45990 Exercise induced bronchospasm: Secondary | ICD-10-CM | POA: Insufficient documentation

## 2022-10-31 NOTE — Progress Notes (Unsigned)
Established Patient Office Visit  Subjective   Patient ID: Julie Martin, female    DOB: [birth year]  Age: 52 y.o. MRN: [id number]  Chief Complaint  Patient presents with   Follow-up    HPI Pt is a 52 yo obese female with HTN, MDD, Anxiety, hypothyroidism who presents to the clinic for follow up. She also notes that she has had a dry cough for a few months. It almost feels like a throat clearing. Not worse after she eats and denies any reflux problems. No SOB except when she exercises. She does have history of asthma. She needs albuterol refilled. She does feel like cough does worsen with exercise. No stool changes. Denies any melena or hematochezia.   Her stress and anxiety are increased right now. She is switching jobs and hoping that will help. No SI/HC. She is going to counseling.   She is not checking BP at home. She denies any CP, palpitations, headaches or SOB.   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   Obesity 01/18/2016   Essential hypertension, benign 03/16/2016   Trouble in sleeping 06/29/2016   Orthostatic hypotension 11/02/2016   Murmur 03/23/2017   Lumbago 06/27/2017   Chronic cough 10/31/2022   Resolved Ambulatory Problems    Diagnosis Date Noted   Uterine polyp 02/26/2014   DUB (dysfunctional uterine bleeding) 02/26/2014   Elevated blood pressure 06/12/2015   Concussion without loss of consciousness 04/05/2016   Past Medical History:  Diagnosis Date   Abnormal cervical Papanicolaou smear    Depression    Heart murmur      ROS See HPI.    Objective:     BP (!) 150/99   Pulse 64   Ht 5\' 3"  (1.6 m)   Wt 182 lb (82.6 kg)   SpO2 99%   BMI 32.24 kg/m  BP Readings from Last 3 Encounters:  10/26/22 (!) 150/99  03/12/21 (!) 133/96  02/02/21  (!) 154/126   Wt Readings from Last 3 Encounters:  10/26/22 182 lb (82.6 kg)  03/12/21 182 lb (82.6 kg)  03/01/21 182 lb (82.6 kg)   ..    10/26/2022    9:53 AM 08/04/2021    2:15 PM 07/22/2020    7:27 AM 08/22/2018    8:19 AM 04/23/2018   11:59 AM  Depression screen PHQ 2/9  Decreased Interest 1 2 1  0 1  Down, Depressed, Hopeless 2 2 2 2 1   PHQ - 2 Score 3 4 3 2 2   Altered sleeping 1 0 2 2 2   Tired, decreased energy 2 0 2 2 1   Change in appetite 2 0 2 1 2   Feeling bad or failure about yourself  2 0 1 2 2   Trouble concentrating 2 0 2 1 1   Moving slowly or fidgety/restless 1 0 1 1 1   Suicidal thoughts 0 0 1 0 1  PHQ-9 Score 13 4 14 11 12   Difficult doing work/chores Somewhat difficult Not difficult at all Not difficult at all Somewhat difficult Somewhat difficult   .Marland Kitchen    10/26/2022    9:54 AM 07/22/2020    7:27 AM 08/22/2018    8:19 AM 04/23/2018   11:59 AM  GAD 7 : Generalized Anxiety Score  Nervous, Anxious, on Edge 2 2 2  1  Control/stop worrying 2 2 2 1   Worry too much - different things 2 2 2 1   Trouble relaxing 1 2 2 2   Restless 1 1 1 1   Easily annoyed or irritable 2 1 2 2   Afraid - awful might happen 0 0 0 1  Total GAD 7 Score 10 10 11 9   Anxiety Difficulty Somewhat difficult Not difficult at all Somewhat difficult Somewhat difficult        Physical Exam Vitals reviewed.  Constitutional:      Appearance: Normal appearance.  HENT:     Head: Normocephalic.  Cardiovascular:     Rate and Rhythm: Normal rate and regular rhythm.  Pulmonary:     Effort: Pulmonary effort is normal.     Breath sounds: Normal breath sounds.  Musculoskeletal:     Right lower leg: No edema.     Left lower leg: No edema.  Neurological:     General: No focal deficit present.     Mental Status: She is alert and oriented to person, place, and time.  Psychiatric:        Mood and Affect: Mood normal.        The 10-year ASCVD risk score (Arnett DK, et al., 2019) is: 2.2%     Assessment & Plan:  Marland KitchenMarland KitchenMadora was seen today for follow-up.  Diagnoses and all orders for this visit:  Essential hypertension, benign -     COMPLETE METABOLIC PANEL WITH GFR -     losartan-hydrochlorothiazide (HYZAAR) 100-25 MG tablet; Take 1 tablet by mouth daily.  Hypothyroidism, unspecified type -     TSH  Medication management -     TSH -     Lipid Panel w/reflex Direct LDL -     COMPLETE METABOLIC PANEL WITH GFR -     CBC with Differential/Platelet  Screening for lipid disorders -     Lipid Panel w/reflex Direct LDL  Encounter for screening mammogram for malignant neoplasm of breast -     MM 3D SCREENING MAMMOGRAM BILATERAL BREAST  Chronic cough -     albuterol (VENTOLIN HFA) 108 (90 Base) MCG/ACT inhaler; Inhale 2 puffs into the lungs every 6 (six) hours as needed.  Anxiety and depression -     buPROPion (WELLBUTRIN XL) 150 MG 24 hr tablet; Take 1 tablet (150 mg total) by mouth daily.  Severe episode of recurrent major depressive disorder, without psychotic features ([place]) -     buPROPion (WELLBUTRIN XL) 150 MG 24 hr tablet; Take 1 tablet (150 mg total) by mouth daily.   BP not to goal and question if cough is ACE related Start Hyzaar for BP control  Recheck BP in 2 weeks and see if cough resolves Do not suspect GERD as cause for cough Albuterol refilled to use 15 minutes before exercise  PHQ and GAD not to goal Continue on same wellbutrin for now Pt does not want to add or change medications but wants to wait to see how she feels after job change Continue with counseling  TSH to check and will adjust accordingly  Fasting labs ordered Mammogram ordered   Return in about 2 weeks (around 11/09/2022) for nurse visit/ 4-6 weeks stress/cough.    Iran Planas, PA-C

## 2022-11-09 ENCOUNTER — Ambulatory Visit (INDEPENDENT_AMBULATORY_CARE_PROVIDER_SITE_OTHER): Payer: BC Managed Care – PPO | Admitting: Physician Assistant

## 2022-11-09 VITALS — BP 136/88 | HR 65 | Temp 97.7°F | Ht 62.0 in | Wt 187.1 lb

## 2022-11-09 DIAGNOSIS — I10 Essential (primary) hypertension: Secondary | ICD-10-CM | POA: Diagnosis not present

## 2022-11-09 MED ORDER — LOSARTAN POTASSIUM-HCTZ 100-25 MG PO TABS: 1.0000 | ORAL_TABLET | Freq: Every day | ORAL | 1 refills | Status: DC

## 2022-11-09 NOTE — Progress Notes (Signed)
Agree with above plan.  BP improved.  Sent refills for 6 months.

## 2022-11-09 NOTE — Progress Notes (Signed)
Patient is here for blood pressure check. Denies trouble sleeping, palpitations, dizziness, lightheadedness, blurry vision, chest pain, shortness of breath, headaches and/or medication problems.   Patient's blood pressure was within goal range 136/88, (p) 65. Patient did not bring in home bp machine for calibration.   Patient informed to schedule next appointment in 01/2023 .

## 2022-11-17 ENCOUNTER — Other Ambulatory Visit: Payer: Self-pay | Admitting: Physician Assistant

## 2022-11-17 DIAGNOSIS — R053 Chronic cough: Secondary | ICD-10-CM

## 2022-12-05 ENCOUNTER — Ambulatory Visit: Payer: BC Managed Care – PPO | Admitting: Physician Assistant

## 2022-12-05 ENCOUNTER — Encounter: Payer: Self-pay | Admitting: Physician Assistant

## 2022-12-05 VITALS — BP 162/92 | HR 59 | Ht 62.0 in | Wt 187.0 lb

## 2022-12-05 DIAGNOSIS — H6992 Unspecified Eustachian tube disorder, left ear: Secondary | ICD-10-CM

## 2022-12-05 DIAGNOSIS — H9202 Otalgia, left ear: Secondary | ICD-10-CM | POA: Diagnosis not present

## 2022-12-05 MED ORDER — METHYLPREDNISOLONE 4 MG PO TBPK: ORAL_TABLET | ORAL | 0 refills | Status: DC

## 2022-12-05 MED ORDER — AMOXICILLIN 875 MG PO TABS: 875.0000 mg | ORAL_TABLET | Freq: Two times a day (BID) | ORAL | 0 refills | Status: DC

## 2022-12-05 NOTE — Patient Instructions (Signed)
Eustachian Tube Dysfunction  Eustachian tube dysfunction refers to a condition in which a blockage develops in the narrow passage that connects the middle ear to the back of the nose (eustachian tube). The eustachian tube regulates air pressure in the middle ear by letting air move between the ear and nose. It also helps to drain fluid from the middle ear space. Eustachian tube dysfunction can affect one or both ears. When the eustachian tube does not function properly, air pressure, fluid, or both can build up in the middle ear. What are the causes? This condition occurs when the eustachian tube becomes blocked or cannot open normally. Common causes of this condition include: Ear infections. Colds and other infections that affect the nose, mouth, and throat (upper respiratory tract). Allergies. Irritation from cigarette smoke. Irritation from stomach acid coming up into the esophagus (gastroesophageal reflux). The esophagus is the part of the body that moves food from the mouth to the stomach. Sudden changes in air pressure, such as from descending in an airplane or scuba diving. Abnormal growths in the nose or throat, such as: Growths that line the nose (nasal polyps). Abnormal growth of cells (tumors). Enlarged tissue at the back of the throat (adenoids). What increases the risk? You are more likely to develop this condition if: You smoke. You are overweight. You are a child who has: Certain birth defects of the mouth, such as cleft palate. Large tonsils or adenoids. What are the signs or symptoms? Common symptoms of this condition include: A feeling of fullness in the ear. Ear pain. Clicking or popping noises in the ear. Ringing in the ear (tinnitus). Hearing loss. Loss of balance. Dizziness. Symptoms may get worse when the air pressure around you changes, such as when you travel to an area of high elevation, fly on an airplane, or go scuba diving. How is this diagnosed? This  condition may be diagnosed based on: Your symptoms. A physical exam of your ears, nose, and throat. Tests, such as those that measure: The movement of your eardrum. Your hearing (audiometry). How is this treated? Treatment depends on the cause and severity of your condition. In mild cases, you may relieve your symptoms by moving air into your ears. This is called "popping the ears." In more severe cases, or if you have symptoms of fluid in your ears, treatment may include: Medicines to relieve congestion (decongestants). Medicines that treat allergies (antihistamines). Nasal sprays or ear drops that contain medicines that reduce swelling (steroids). A procedure to drain the fluid in your eardrum. In this procedure, a small tube may be placed in the eardrum to: Drain the fluid. Restore the air in the middle ear space. A procedure to insert a balloon device through the nose to inflate the opening of the eustachian tube (balloon dilation). Follow these instructions at home: Lifestyle Do not do any of the following until your health care provider approves: Travel to high altitudes. Fly in airplanes. Work in a pressurized cabin or room. Scuba dive. Do not use any products that contain nicotine or tobacco. These products include cigarettes, chewing tobacco, and vaping devices, such as e-cigarettes. If you need help quitting, ask your health care provider. Keep your ears dry. Wear fitted earplugs during showering and bathing. Dry your ears completely after. General instructions Take over-the-counter and prescription medicines only as told by your health care provider. Use techniques to help pop your ears as recommended by your health care provider. These may include: Chewing gum. Yawning. Frequent, forceful swallowing.   Closing your mouth, holding your nose closed, and gently blowing as if you are trying to blow air out of your nose. Keep all follow-up visits. This is important. Contact a  health care provider if: Your symptoms do not go away after treatment. Your symptoms come back after treatment. You are unable to pop your ears. You have: A fever. Pain in your ear. Pain in your head or neck. Fluid draining from your ear. Your hearing suddenly changes. You become very dizzy. You lose your balance. Get help right away if: You have a sudden, severe increase in any of your symptoms. Summary Eustachian tube dysfunction refers to a condition in which a blockage develops in the eustachian tube. It can be caused by ear infections, allergies, inhaled irritants, or abnormal growths in the nose or throat. Symptoms may include ear pain or fullness, hearing loss, or ringing in the ears. Mild cases are treated with techniques to unblock the ears, such as yawning or chewing gum. More severe cases are treated with medicines or procedures. This information is not intended to replace advice given to you by your health care provider. Make sure you discuss any questions you have with your health care provider. Document Revised: 09/28/2020 Document Reviewed: 09/28/2020 Elsevier Patient Education  2023 [employer] Inc.  

## 2022-12-05 NOTE — Progress Notes (Signed)
Acute Office Visit  Subjective:     Patient ID: Julie Martin, female    DOB: 1970-11-09, 52 y.o.   MRN: [id number]  Chief Complaint  Patient presents with   Ear Pain    HPI Patient is in today for left ear pain for 1 day. Hx of ear infections and wanted it treated early. Not tried anything to make better. Rates pain 3/10. Denies any ear drainage, fever, chills, body aches, cough, sinus pressure.   .. 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   Obesity 01/18/2016   Essential hypertension, benign 03/16/2016   Trouble in sleeping 06/29/2016   Orthostatic hypotension 11/02/2016   Murmur 03/23/2017   Lumbago 06/27/2017   Chronic cough 10/31/2022   Exercise-induced asthma 10/31/2022   Resolved Ambulatory Problems    Diagnosis Date Noted   Uterine polyp 02/26/2014   DUB (dysfunctional uterine bleeding) 02/26/2014   Elevated blood pressure 06/12/2015   Concussion without loss of consciousness 04/05/2016   Past Medical History:  Diagnosis Date   Abnormal cervical Papanicolaou smear    Depression    Heart murmur      ROS  See HPI.     Objective:    BP (!) 162/92   Pulse (!) 59   Ht 5\' 2"  (1.575 m)   Wt 187 lb (84.8 kg)   SpO2 99%   BMI 34.20 kg/m  BP Readings from Last 3 Encounters:  12/05/22 (!) 162/92  11/09/22 136/88  10/26/22 (!) 150/99   Wt Readings from Last 3 Encounters:  12/05/22 187 lb (84.8 kg)  11/09/22 187 lb 1.3 oz (84.9 kg)  10/26/22 182 lb (82.6 kg)      Physical Exam Constitutional:      Appearance: Normal appearance.  HENT:     Head: Normocephalic.     Comments: No tenderness over maxillary or frontal sinuses    Right Ear: Tympanic membrane, ear canal and external ear normal. There is no impacted cerumen.     Left Ear: Ear  canal and external ear normal. There is no impacted cerumen.     Ears:     Comments: Left TM dull and appears like effusion behind TM. Tenderness to palpation over tragus.     Nose: Nose normal.     Mouth/Throat:     Pharynx: No oropharyngeal exudate or posterior oropharyngeal erythema.  Eyes:     General:        Right eye: No discharge.        Left eye: No discharge.     Extraocular Movements: Extraocular movements intact.     Conjunctiva/sclera: Conjunctivae normal.     Pupils: Pupils are equal, round, and reactive to light.  Neck:     Vascular: No carotid bruit.  Cardiovascular:     Rate and Rhythm: Normal rate and regular rhythm.  Pulmonary:     Effort: Pulmonary effort is normal.  Musculoskeletal:     Cervical back: Normal range of motion. No rigidity or tenderness.  Lymphadenopathy:     Cervical: No cervical adenopathy.  Neurological:     Mental Status: She is alert.          Assessment & Plan:  Marland KitchenMarland KitchenLadonia was seen today for ear pain.  Diagnoses and all orders for this visit:  Left ear  pain -     methylPREDNISolone (MEDROL DOSEPAK) 4 MG TBPK tablet; Take as directed by package insert. -     amoxicillin (AMOXIL) 875 MG tablet; Take 1 tablet (875 mg total) by mouth 2 (two) times daily. For 10 days.  ETD (Eustachian tube dysfunction), left -     methylPREDNISolone (MEDROL DOSEPAK) 4 MG TBPK tablet; Take as directed by package insert.   No URI symptoms No sign of active ear infection of external or internal Start with medrol dose pack If symptoms persist add amoxil for 10 days HO given on ETD  Follow up as needed or if symptoms worsen  Tandy Gaw, PA-C

## 2022-12-09 ENCOUNTER — Ambulatory Visit: Payer: BC Managed Care – PPO | Admitting: Physician Assistant

## 2022-12-09 ENCOUNTER — Encounter: Payer: Self-pay | Admitting: Physician Assistant

## 2022-12-09 VITALS — BP 145/88 | HR 64 | Resp 20 | Ht 62.0 in | Wt 190.1 lb

## 2022-12-09 DIAGNOSIS — B029 Zoster without complications: Secondary | ICD-10-CM

## 2022-12-09 MED ORDER — VALACYCLOVIR HCL 1 G PO TABS: 1000.0000 mg | ORAL_TABLET | Freq: Three times a day (TID) | ORAL | 0 refills | Status: DC

## 2022-12-09 NOTE — Progress Notes (Signed)
Acute Office Visit  Subjective:     Patient ID: Julie Martin, female    DOB: 02-08-1971, 52 y.o.   MRN: [id number]  No chief complaint on file.   HPI Patient is in today for rash on left side of face with tingling. She started augmentin and prednisone for ear infection on Tuesday. Symptoms started Tuesday afternoon. Rash is only on left forehead and scalp. Left eye feels like "sandpaper". No fever, chills, body aches.   .. 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   Obesity 01/18/2016   Essential hypertension, benign 03/16/2016   Trouble in sleeping 06/29/2016   Orthostatic hypotension 11/02/2016   Murmur 03/23/2017   Lumbago 06/27/2017   Chronic cough 10/31/2022   Exercise-induced asthma 10/31/2022   Herpes zoster without complication 12/09/2022   Resolved Ambulatory Problems    Diagnosis Date Noted   Uterine polyp 02/26/2014   DUB (dysfunctional uterine bleeding) 02/26/2014   Elevated blood pressure 06/12/2015   Concussion without loss of consciousness 04/05/2016   Past Medical History:  Diagnosis Date   Abnormal cervical Papanicolaou smear    Depression    Heart murmur      ROS  See HPI.     Objective:    There were no vitals taken for this visit. BP Readings from Last 3 Encounters:  12/05/22 (!) 162/92  11/09/22 136/88  10/26/22 (!) 150/99   Wt Readings from Last 3 Encounters:  12/05/22 187 lb (84.8 kg)  11/09/22 187 lb 1.3 oz (84.9 kg)  10/26/22 182 lb (82.6 kg)      Physical Exam Constitutional:      Appearance: Normal appearance. She is obese.  HENT:     Head: Normocephalic.     Right Ear: Tympanic membrane, ear canal and external ear normal. There is no impacted cerumen.     Left Ear: Tympanic membrane, ear canal and external ear  normal. There is no impacted cerumen.     Ears:     Comments: No mastoid tenderness.     Nose: Nose normal.     Mouth/Throat:     Mouth: Mucous membranes are moist.     Pharynx: No oropharyngeal exudate or posterior oropharyngeal erythema.  Eyes:     Conjunctiva/sclera: Conjunctivae normal.  Neck:     Comments: Pea-sized post auricular lymphnode on left Cardiovascular:     Rate and Rhythm: Normal rate and regular rhythm.  Pulmonary:     Effort: Pulmonary effort is normal.     Breath sounds: Normal breath sounds.  Musculoskeletal:     Right lower leg: No edema.     Left lower leg: No edema.  Skin:    Comments: Erythematous rash slightly raised in some parts on left side of face mostly on scalp and forehead.   Neurological:     General: No focal deficit present.     Mental Status: She is alert and oriented to person, place, and time.     Cranial Nerves: No cranial nerve deficit.     Motor: No weakness.  Psychiatric:        Mood and Affect: Mood normal.          Assessment & Plan:  Marland KitchenMarland KitchenDiagnoses and all orders for this visit:  Herpes zoster without complication -  valACYclovir (VALTREX) 1000 MG tablet; Take 1 tablet (1,000 mg total) by mouth 3 (three) times daily.   Valtrex started for shingles treatment Symptomatic care discussed HO given As long as no vesicles patient is not contagious and can go to work Follow up as needed if symptoms persist or worsen  Tandy Gaw, PA-C

## 2022-12-09 NOTE — Patient Instructions (Signed)

## 2022-12-23 ENCOUNTER — Other Ambulatory Visit: Payer: Self-pay | Admitting: Physician Assistant

## 2022-12-23 DIAGNOSIS — R053 Chronic cough: Secondary | ICD-10-CM

## 2023-01-09 ENCOUNTER — Other Ambulatory Visit: Payer: Self-pay | Admitting: Physician Assistant

## 2023-01-09 DIAGNOSIS — Z1231 Encounter for screening mammogram for malignant neoplasm of breast: Secondary | ICD-10-CM

## 2023-01-11 DIAGNOSIS — Z1231 Encounter for screening mammogram for malignant neoplasm of breast: Secondary | ICD-10-CM

## 2023-01-18 ENCOUNTER — Encounter: Payer: Self-pay | Admitting: Physician Assistant

## 2023-01-23 ENCOUNTER — Ambulatory Visit: Payer: BC Managed Care – PPO | Admitting: Physician Assistant

## 2023-01-23 ENCOUNTER — Encounter: Payer: Self-pay | Admitting: Physician Assistant

## 2023-01-23 VITALS — BP 150/72 | HR 66 | Ht 62.0 in | Wt 193.0 lb

## 2023-01-23 DIAGNOSIS — H9192 Unspecified hearing loss, left ear: Secondary | ICD-10-CM

## 2023-01-23 DIAGNOSIS — H9193 Unspecified hearing loss, bilateral: Secondary | ICD-10-CM | POA: Diagnosis not present

## 2023-01-23 DIAGNOSIS — H918X2 Other specified hearing loss, left ear: Secondary | ICD-10-CM | POA: Diagnosis not present

## 2023-01-23 DIAGNOSIS — H6592 Unspecified nonsuppurative otitis media, left ear: Secondary | ICD-10-CM

## 2023-01-23 DIAGNOSIS — Z8669 Personal history of other diseases of the nervous system and sense organs: Secondary | ICD-10-CM | POA: Diagnosis not present

## 2023-01-23 DIAGNOSIS — B028 Zoster with other complications: Secondary | ICD-10-CM | POA: Diagnosis not present

## 2023-01-23 MED ORDER — FLUTICASONE PROPIONATE 50 MCG/ACT NA SUSP
2.0000 | Freq: Every day | NASAL | 0 refills | Status: DC
Start: 2023-01-23 — End: 2023-02-22

## 2023-01-23 NOTE — Progress Notes (Signed)
Acute Office Visit  Subjective:     Patient ID: Julie Martin, female    DOB: [birth year], 52 y.o.   MRN: [id number]  Chief Complaint  Patient presents with   Hearing Problem    Left ear    HPI Patient is in today for hearing loss after shingles in left ear. She has finished medrol dose pack and valtrex. She has hx of hearing loss in right ear. She continues to have numbness on the left side of her face and ear. She is finding she is having to turn TV up, having trouble hearing at work and having to use ear piece to hear on telephone. She continues to have numbness over left ear and face where shingles was. Rash has almost cleared. No dizziness or vision changes.    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   Middle ear effusion, left 01/23/2023   Bilateral hearing loss 01/23/2023   Resolved Ambulatory Problems    Diagnosis Date Noted   Uterine polyp 02/26/2014   DUB (dysfunctional uterine bleeding) 02/26/2014   Elevated blood pressure 06/12/2015   Concussion without loss of consciousness 04/05/2016   Past Medical History:  Diagnosis Date   Abnormal cervical Papanicolaou smear    Depression    Heart murmur      ROS See HPI.      Objective:    BP (!) 150/72 (BP Location: Left Arm, Patient Position: Sitting, Cuff Size: Normal)   Pulse 66   Ht 5\' 2"  (1.575 m)   Wt 193 lb (87.5 kg)   SpO2 96%   BMI 35.30 kg/m  BP Readings from Last 3  Encounters:  01/23/23 (!) 150/72  12/09/22 (!) 145/88  12/05/22 (!) 162/92   Wt Readings from Last 3 Encounters:  01/23/23 193 lb (87.5 kg)  12/09/22 190 lb 1.9 oz (86.2 kg)  12/05/22 187 lb (84.8 kg)    .Marland Kitchen Hearing Screening   500Hz  1000Hz  2000Hz  4000Hz   Right ear 40 20 20 20   Left ear 40 20 25 20      Physical Exam Constitutional:      Appearance: Normal appearance. She is obese.  HENT:     Head: Normocephalic.     Right Ear: Tympanic membrane, ear canal and external ear normal. There is no impacted cerumen.     Left Ear: Ear canal and external ear normal. There is no impacted cerumen.     Ears:     Comments: Effusion behind TM.  Bilateral gross hearing intact.     Nose: Nose normal.     Mouth/Throat:     Mouth: Mucous membranes are moist.  Cardiovascular:     Rate and Rhythm: Normal rate.  Pulmonary:     Effort: Pulmonary effort is normal.  Skin:    Comments: Left facial dried scabbed over vesicles scattered.   Neurological:     General: No focal deficit present.     Mental Status: She is alert and oriented to person,  place, and time.     Cranial Nerves: No cranial nerve deficit.     Sensory: No sensory deficit.     Motor: No weakness.     Coordination: Coordination normal.     Gait: Gait normal.     Deep Tendon Reflexes: Reflexes normal.  Psychiatric:        Mood and Affect: Mood normal.          Assessment & Plan:  Marland KitchenMarland KitchenDavyn was seen today for hearing problem.  Diagnoses and all orders for this visit:  Hearing loss of left ear, unspecified hearing loss type -     Ambulatory referral to Audiology  Herpes zoster with other complication -     Ambulatory referral to Audiology  Middle ear effusion, left -     fluticasone (FLONASE) 50 MCG/ACT nasal spray; Place 2 sprays into both nostrils daily. -     Ambulatory referral to Audiology  Bilateral hearing loss, unspecified hearing loss type -     Ambulatory referral to Audiology   Low frequency hearing  loss detected. Will send to audiology for more testing.  Start flonase to see if will resolve the middle ear effusion.  Follow up as needed.    Tandy Gaw, PA-C

## 2023-01-26 ENCOUNTER — Ambulatory Visit (INDEPENDENT_AMBULATORY_CARE_PROVIDER_SITE_OTHER): Payer: BC Managed Care – PPO

## 2023-01-26 DIAGNOSIS — Z1231 Encounter for screening mammogram for malignant neoplasm of breast: Secondary | ICD-10-CM

## 2023-01-30 NOTE — Progress Notes (Signed)
Normal mammogram. Follow up in 1 year.

## 2023-01-31 ENCOUNTER — Ambulatory Visit: Payer: BC Managed Care – PPO | Attending: Physician Assistant | Admitting: Audiologist

## 2023-01-31 DIAGNOSIS — H9193 Unspecified hearing loss, bilateral: Secondary | ICD-10-CM

## 2023-01-31 NOTE — Procedures (Signed)
  Outpatient Audiology and [facility] [address] [place], [place]  [address] [phone]  AUDIOLOGICAL  EVALUATION  NAME: Julie Martin     DOB:   Nov 22, 1970      MRN: [id number]                                                                                     DATE: 01/31/2023     REFERENT: Jomarie Longs, PA-C STATUS: Outpatient DIAGNOSIS: Decreased Hearing   History: Julie Martin was seen for an audiological evaluation. Julie Martin is receiving a hearing evaluation due to concerns for hearing loss in the left. Loss onset after having shingles in May on the left side of her face. This difficulty hearing began suddenly. No pain or pressure reported in either ear. There is pain behind her left pinna over the mastoid Tinnitus denied ears. 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 She has difficulty on the phone. No other relevant case history reported.   Evaluation:  Otoscopy showed a clear view of the tympanic membranes, bilaterally Tympanometry results were consistent with normal but shallow middle ear function. Dizziness present with left ear tympanometry. Audiometric testing was completed using conventional audiometry with insert transducer. Speech Recognition Thresholds were 10dB in the right ear and 15dB in the left ear. Word Recognition was performed 40dB SL, scored 100% in the right ear and 100% in the left ear. Pure tone thresholds show normal hearing in each ear.    Results:  The test results were reviewed with Julie Martin. She has normal hearing in both ears. No indication of permanent loss. Follow up with Otolaryngology if any new symptoms occur.    Recommendations: 1.   No further audiologic testing is needed unless future hearing concerns arise.   33 minutes spent testing and counseling on results.   Ammie Ferrier  Audiologist, Au.D., CCC-A 01/31/2023  2:45 PM  Cc: Jomarie Longs, PA-C

## 2023-02-18 ENCOUNTER — Other Ambulatory Visit: Payer: Self-pay | Admitting: Physician Assistant

## 2023-02-18 DIAGNOSIS — H6592 Unspecified nonsuppurative otitis media, left ear: Secondary | ICD-10-CM

## 2023-04-21 ENCOUNTER — Telehealth: Payer: BC Managed Care – PPO | Admitting: Physician Assistant

## 2023-04-21 DIAGNOSIS — H9202 Otalgia, left ear: Secondary | ICD-10-CM

## 2023-04-21 MED ORDER — CIPROFLOXACIN-DEXAMETHASONE 0.3-0.1 % OT SUSP
4.0000 [drp] | Freq: Two times a day (BID) | OTIC | 0 refills | Status: DC
Start: 2023-04-21 — End: 2023-09-08

## 2023-04-21 NOTE — E-Visit Routing Comment (Signed)
Patient scheduled.

## 2023-04-21 NOTE — Progress Notes (Signed)
E-Visit for Ear Pain - Acute Otitis Media   We are sorry that you are not feeling well. Here is how we plan to help!  Based on what you have shared with me it looks like you have Acute Otitis Media.  Acute Otitis Media is an infection of the middle or "inner" ear. This type of infection can cause redness, inflammation, and fluid buildup behind the tympanic membrane (ear drum).  The usual symptoms include: Earache/Pain Fever Upper respiratory symptoms Lack of energy/Fatigue/Malaise Slight hearing loss gradually worsening- if the inner ear fills with fluid What causes middle ear infections? Most middle ear infections occur when an infection such as a cold, leads to a build-up of mucus in the middle ear and causes the Eustachian tube (a thin tube that runs from the middle ear to the back of the nose) to become swollen or blocked.   This means mucus can't drain away properly, making it easier for an infection to spread into the middle ear.  How middle ear infections are treated: Most ear infections clear up within three to five days and don't need any specific treatment. If necessary, tylenol or ibuprofen should be used to relieve pain and a high temperature.  If you develop a fever higher than 102, or any significantly worsening symptoms, this could indicate a more serious infection moving to the middle/inner and needs face to face evaluation in an office by a provider.   Antibiotics aren't routinely used to treat middle ear infections, although they may occasionally be prescribed if symptoms persist or are particularly severe. Given your presentation,   I have prescribed: Ciprofloxin 0.3% and dexamethasone 0.1% otic suspension 4 drops in affected ears twice daily for 7 days   Your symptoms should improve over the next 3 days and should resolve in about 7 days. Be sure to complete ALL of the prescription(s) given. If symptoms continue to worsen despite the medication prescribed I would highly  recommend for you to be seen since you have been having issues with the left ear for a few months. May be best for an ear exam.   HOME CARE: Wash your hands frequently. If you are prescribed an ear drop, do not place the tip of the bottle on your ear or touch it with your fingers. You can take Acetaminophen 650 mg every 4-6 hours as needed for pain.  If pain is severe or moderate, you can apply a heating pad (set on low) or hot water bottle (wrapped in a towel) to outer ear for 20 minutes.  This will also increase drainage.  GET HELP RIGHT AWAY IF: Fever is over 102.2 degrees. You develop progressive ear pain or hearing loss. Ear symptoms persist longer than 3 days after treatment.  MAKE SURE YOU: Understand these instructions. Will watch your condition. Will get help right away if you are not doing well or get worse.  Thank you for choosing an e-visit.  Your e-visit answers were reviewed by a board certified advanced clinical practitioner to complete your personal care plan. Depending upon the condition, your plan could have included both over the counter or prescription medications.  Please review your pharmacy choice. Make sure the pharmacy is open so you can pick up the prescription now. If there is a problem, you may contact your provider through [employer] and have the prescription routed to another pharmacy.  Your safety is important to [place]. If you have drug allergies check your prescription carefully.   For the next  24 hours you can use MyChart to ask questions about today's visit, request a non-urgent call back, or ask for a work or school excuse. You will get an email with a survey after your eVisit asking about your experience. We would appreciate your feedback. I hope that your e-visit has been valuable and will aid in your recovery.   I have spent 5 minutes in review of e-visit questionnaire, review and updating patient chart, medical decision making and response to  patient.   Margaretann Loveless, PA-C

## 2023-04-24 ENCOUNTER — Encounter: Payer: Self-pay | Admitting: Physician Assistant

## 2023-04-24 ENCOUNTER — Ambulatory Visit: Payer: BC Managed Care – PPO | Admitting: Physician Assistant

## 2023-04-24 VITALS — BP 128/86 | HR 72 | Ht 62.0 in | Wt 197.0 lb

## 2023-04-24 DIAGNOSIS — Z6835 Body mass index (BMI) 35.0-35.9, adult: Secondary | ICD-10-CM

## 2023-04-24 DIAGNOSIS — Z131 Encounter for screening for diabetes mellitus: Secondary | ICD-10-CM | POA: Diagnosis not present

## 2023-04-24 DIAGNOSIS — Z13220 Encounter for screening for lipoid disorders: Secondary | ICD-10-CM | POA: Diagnosis not present

## 2023-04-24 DIAGNOSIS — L299 Pruritus, unspecified: Secondary | ICD-10-CM | POA: Diagnosis not present

## 2023-04-24 DIAGNOSIS — E6609 Other obesity due to excess calories: Secondary | ICD-10-CM

## 2023-04-24 DIAGNOSIS — Z8619 Personal history of other infectious and parasitic diseases: Secondary | ICD-10-CM

## 2023-04-24 MED ORDER — SAXENDA 18 MG/3ML ~~LOC~~ SOPN
3.0000 mg | PEN_INJECTOR | Freq: Every day | SUBCUTANEOUS | 0 refills | Status: DC
Start: 2023-04-24 — End: 2023-09-08

## 2023-04-24 MED ORDER — VALACYCLOVIR HCL 1 G PO TABS
1000.0000 mg | ORAL_TABLET | Freq: Three times a day (TID) | ORAL | 0 refills | Status: DC
Start: 2023-04-24 — End: 2023-09-08

## 2023-04-24 NOTE — Progress Notes (Unsigned)
Acute Office Visit  Subjective:     Patient ID: Julie Martin, female    DOB: 1971-06-23, 52 y.o.   MRN: [id number]  Chief Complaint  Patient presents with   Ear Pain    Left ear pain /itching     HPI  Patient is a 52 year old female in today for left ear itching. Onset was 04/21/23. Complains of itching inside of her left ear. Patient states that she's been using some OTC ear drops for a few days without symptom relief. Patient additionally complains of itching on the left side of her scalp, but denies any pain or known lesions. Patient denies any right sided ear pain or ear and scalp itching. Patient is concerned as she states her shingles in May started as scalp itching. She has recently had audiometric testing that came back as normal in bilateral ears with no indication of hearing loss.  Patient also complains of weight gain. States that she's gained about 30 pounds in the past 5 months. Patient states that her diet is good and is using weight watchers. She exercises 6 days a week doing yoga and piliates and runs 3 days a week. States that she sleeps well. Patient does state that she's had an increase in stress the past few months. Wants to discuss medication options for weight loss today.   ROS      Objective:    BP 128/86 (BP Location: Left Arm)   Pulse 72   Ht 5\' 2"  (1.575 m)   Wt 89.4 kg   SpO2 99%   BMI 36.03 kg/m  {Vitals History (Optional):23777}  Physical Exam  No results found for any visits on 04/24/23.      Assessment & Plan:   Problem List Items Addressed This Visit       Other   Obesity   Relevant Medications   Liraglutide -Weight Management (SAXENDA) 18 MG/3ML SOPN   Other Relevant Orders   TSH   CBC w/Diff/Platelet   Other Visit Diagnoses     Screening for lipid disorders    -  Primary   Relevant Orders   Lipid panel   CBC w/Diff/Platelet   Screening for diabetes mellitus       Relevant Orders   CMP14+EGFR   CBC w/Diff/Platelet        Meds ordered this encounter  Medications   Liraglutide -Weight Management (SAXENDA) 18 MG/3ML SOPN    Sig: Inject 3 mg into the skin daily. 0.6 mg injected subcutaneously daily for 1 week, then increase by 0.6 mg weekly until reaching 3 mg injected subcutaneous daily    Dispense:  15 mL    Refill:  0    Patient will have discount coupon, please include novofine needles QS    Order Specific Question:   Supervising Provider    Answer:   Agap[employer] Games [[id number]]   valACYclovir (VALTREX) 1000 MG tablet    Sig: Take 1 tablet (1,000 mg total) by mouth 3 (three) times daily.    Dispense:  21 tablet    Refill:  0    Order Specific Question:   Supervising Provider    Answer:   Nani Gasser D [[id number]]      No follow-ups on file.  [name], [place]

## 2023-04-25 ENCOUNTER — Encounter: Payer: Self-pay | Admitting: Physician Assistant

## 2023-04-25 DIAGNOSIS — L299 Pruritus, unspecified: Secondary | ICD-10-CM | POA: Insufficient documentation

## 2023-04-26 LAB — CBC WITH DIFFERENTIAL/PLATELET
Basophils Absolute: 0 10*3/uL (ref 0.0–0.2)
Basos: 1 %
EOS (ABSOLUTE): 0.1 10*3/uL (ref 0.0–0.4)
Eos: 1 %
Hematocrit: 42.7 % (ref 34.0–46.6)
Hemoglobin: 14.1 g/dL (ref 11.1–15.9)
Immature Grans (Abs): 0 10*3/uL (ref 0.0–0.1)
Immature Granulocytes: 0 %
Lymphocytes Absolute: 2.2 10*3/uL (ref 0.7–3.1)
Lymphs: 31 %
MCH: 29.5 pg (ref 26.6–33.0)
MCHC: 33 g/dL (ref 31.5–35.7)
MCV: 89 fL (ref 79–97)
Monocytes Absolute: 0.4 10*3/uL (ref 0.1–0.9)
Monocytes: 6 %
Neutrophils Absolute: 4.4 10*3/uL (ref 1.4–7.0)
Neutrophils: 61 %
Platelets: 249 10*3/uL (ref 150–450)
RBC: 4.78 x10E6/uL (ref 3.77–5.28)
RDW: 11.7 % (ref 11.7–15.4)
WBC: 7.1 10*3/uL (ref 3.4–10.8)

## 2023-04-26 LAB — TSH: TSH: 2.21 u[IU]/mL (ref 0.450–4.500)

## 2023-04-26 LAB — CMP14+EGFR
ALT: 17 IU/L (ref 0–32)
AST: 22 IU/L (ref 0–40)
Albumin: 4.2 g/dL (ref 3.9–4.9)
Alkaline Phosphatase: 81 IU/L (ref 44–121)
BUN/Creatinine Ratio: 21 (ref 9–23)
BUN: 18 mg/dL (ref 6–24)
Bilirubin Total: 0.3 mg/dL (ref 0.0–1.2)
CO2: 23 mmol/L (ref 20–29)
Calcium: 9.2 mg/dL (ref 8.7–10.2)
Chloride: 103 mmol/L (ref 96–106)
Creatinine, Ser: 0.86 mg/dL (ref 0.57–1.00)
Globulin, Total: 2.3 g/dL (ref 1.5–4.5)
Glucose: 85 mg/dL (ref 70–99)
Potassium: 4.6 mmol/L (ref 3.5–5.2)
Sodium: 142 mmol/L (ref 134–144)
Total Protein: 6.5 g/dL (ref 6.0–8.5)
eGFR: 82 mL/min/{1.73_m2} (ref >59)

## 2023-04-26 LAB — LIPID PANEL
Chol/HDL Ratio: 3.5 ratio (ref 0.0–4.4)
Cholesterol, Total: 195 mg/dL (ref 100–199)
HDL: 56 mg/dL (ref >39)
LDL Chol Calc (NIH): 121 mg/dL — ABNORMAL HIGH (ref 0–99)
Triglycerides: 103 mg/dL (ref 0–149)
VLDL Cholesterol Cal: 18 mg/dL (ref 5–40)

## 2023-04-26 NOTE — Progress Notes (Signed)
[name],   Kidney, liver, glucose look good.  HDL, good cholesterol, looks good.  LDL, bad cholesterol, not to goal.   Your 10 year CV risk is still low under 7.5 percent. Continue to work on weight loss and healthy diet and will recheck in 1 year.   Marland Kitchen.The 10-year ASCVD risk score (Arnett DK, et al., 2019) is: 1.6%   Values used to calculate the score:     Age: 52 years     Sex: Female     Is Non-Hispanic African American: No     Diabetic: No     Tobacco smoker: No     Systolic Blood Pressure: 128 mmHg     Is BP treated: Yes     HDL Cholesterol: 56 mg/dL     Total Cholesterol: 195 mg/dL

## 2023-04-27 ENCOUNTER — Encounter: Payer: Self-pay | Admitting: Physician Assistant

## 2023-06-24 ENCOUNTER — Other Ambulatory Visit: Payer: Self-pay | Admitting: Physician Assistant

## 2023-06-24 DIAGNOSIS — I10 Essential (primary) hypertension: Secondary | ICD-10-CM

## 2023-08-26 ENCOUNTER — Other Ambulatory Visit: Payer: Self-pay | Admitting: Physician Assistant

## 2023-08-26 DIAGNOSIS — H6592 Unspecified nonsuppurative otitis media, left ear: Secondary | ICD-10-CM

## 2023-09-08 ENCOUNTER — Telehealth (INDEPENDENT_AMBULATORY_CARE_PROVIDER_SITE_OTHER): Payer: 59 | Admitting: Physician Assistant

## 2023-09-08 VITALS — Wt 196.0 lb

## 2023-09-08 DIAGNOSIS — E6609 Other obesity due to excess calories: Secondary | ICD-10-CM | POA: Diagnosis not present

## 2023-09-08 DIAGNOSIS — F410 Panic disorder [episodic paroxysmal anxiety] without agoraphobia: Secondary | ICD-10-CM

## 2023-09-08 DIAGNOSIS — Z6835 Body mass index (BMI) 35.0-35.9, adult: Secondary | ICD-10-CM | POA: Diagnosis not present

## 2023-09-08 DIAGNOSIS — E66812 Obesity, class 2: Secondary | ICD-10-CM

## 2023-09-08 MED ORDER — AMBULATORY NON FORMULARY MEDICATION: 0 refills | Status: DC

## 2023-09-08 NOTE — Progress Notes (Signed)
..  Virtual Visit via Video Note  I connected with Adrien LITTIE Mealing on 09/08/23 at  8:10 AM EST by a video enabled telemedicine application and verified that I am speaking with the correct person using two identifiers.  Location: Patient: home Provider: clinic  .SABRAParticipating in visit:  Patient: [name] Provider:Jhoselin Crume PA-C   I discussed the limitations of evaluation and management by telemedicine and the availability of in person appointments. The patient expressed understanding and agreed to proceed.  History of Present Illness: Pt connects to follow up on weight. She has been on sublingual semaglutide through [facility] pharmacy for 3 months with no results. She is logging exercise and food on my fitness pal. She would like to try something else.   She was having panic attacks and counselor referred her to [facility]. Added zoloft and increased wellbutrin . Doing much better.    .. 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   Middle ear effusion, left 01/23/2023   Bilateral hearing loss 01/23/2023   Itching of ear 04/25/2023   Resolved Ambulatory Problems    Diagnosis Date Noted   Uterine polyp 02/26/2014   DUB (dysfunctional uterine bleeding) 02/26/2014   Elevated blood pressure 06/12/2015   Concussion without loss of consciousness 04/05/2016   Past Medical History:  Diagnosis Date   Abnormal cervical Papanicolaou smear    Depression    Heart murmur          Observations/Objective: No acute distress Normal mood and appearance  .SABRA Today's Vitals   09/08/23 0809  Weight: 196 lb (88.9 kg)   Body mass index is 35.85 kg/m.    Assessment and Plan: SABRASABRACurtistine [name] was seen today for hypertension.  Diagnoses and all orders for this visit:  Class 2 obesity due to excess calories without serious comorbidity with body mass index (BMI) of 35.0 to 35.9 in adult  Panic attacks   .SABRADiscussed low carb diet with 1500 calories and 80g of protein.  Exercising at least 150 minutes a week.  My Fitness Pal could be a chief technology officer.  Stop [facility] and [facility] Start 10 units, then 20 units, then 40 units weekly Follow up in 3 months  Continue to follow up with [facility] for medications.    Follow Up Instructions:    I discussed the assessment and treatment plan with the patient. The patient was provided an opportunity to ask questions and all were answered. The patient agreed with the plan and demonstrated an understanding of the instructions.   The patient was advised to call back or seek an in-person evaluation if the symptoms worsen or if the condition fails to improve as anticipated.   Tiamarie Furnari, PA-C

## 2023-09-08 NOTE — Patient Instructions (Signed)
 10 units weekly for 4 weeks 20 units weekly for 4 weeks 40 units weekly until follow up

## 2023-09-12 ENCOUNTER — Telehealth: Payer: Self-pay

## 2023-09-12 NOTE — Telephone Encounter (Signed)
Copied from CRM [id number]. Topic: Clinical - Prescription Issue >> Sep 12, 2023  9:39 AM Fuller Mandril wrote: Reason for CRM: Patient called states she was seen on Friday and was told something would be called in but pharmacy does not have it. Semaglutide 2.5mg  with pyridoxine 10mg  per mL per visit notes should be sent to [facility]. Please reach out to patient with update. Thank You

## 2023-09-13 ENCOUNTER — Encounter: Payer: Self-pay | Admitting: Physician Assistant

## 2023-09-13 NOTE — Telephone Encounter (Signed)
Verbal orders given to pharmacy for all 3 scripts Called and left a voice mail message on patient home # requesting a return call.

## 2023-09-14 ENCOUNTER — Encounter: Payer: Self-pay | Admitting: Physician Assistant

## 2023-10-11 ENCOUNTER — Encounter: Payer: Self-pay | Admitting: Physician Assistant

## 2023-10-13 MED ORDER — AMBULATORY NON FORMULARY MEDICATION: 0 refills | Status: DC

## 2023-10-16 ENCOUNTER — Ambulatory Visit

## 2023-10-16 ENCOUNTER — Ambulatory Visit
Admission: RE | Admit: 2023-10-16 | Discharge: 2023-10-16 | Disposition: A | Discharge status: Home / Self Care | Source: Ambulatory Visit | Attending: Family Medicine | Admitting: Family Medicine

## 2023-10-16 VITALS — BP 134/88 | HR 71 | Temp 97.9°F | Resp 16

## 2023-10-16 DIAGNOSIS — M25541 Pain in joints of right hand: Secondary | ICD-10-CM | POA: Diagnosis not present

## 2023-10-16 DIAGNOSIS — M25531 Pain in right wrist: Secondary | ICD-10-CM | POA: Diagnosis not present

## 2023-10-16 DIAGNOSIS — S63501A Unspecified sprain of right wrist, initial encounter: Secondary | ICD-10-CM

## 2023-10-16 DIAGNOSIS — W19XXXA Unspecified fall, initial encounter: Secondary | ICD-10-CM | POA: Diagnosis not present

## 2023-10-16 DIAGNOSIS — S6991XA Unspecified injury of right wrist, hand and finger(s), initial encounter: Secondary | ICD-10-CM | POA: Diagnosis not present

## 2023-10-16 MED ORDER — PREDNISONE 10 MG (21) PO TBPK: ORAL_TABLET | Freq: Every day | ORAL | 0 refills | Status: DC

## 2023-10-16 MED ORDER — CELECOXIB 200 MG PO CAPS: 200.0000 mg | ORAL_CAPSULE | Freq: Every day | ORAL | 0 refills | Status: AC

## 2023-10-16 NOTE — ED Provider Notes (Signed)
 [facility]    CSN: [id number] Arrival date & time: 10/16/23  7829      History   Chief Complaint Chief Complaint  Patient presents with   Fall    HPI Julie Martin is a 53 y.o. female.   HPI 53 year old female presents for fall yesterday while running.  Patient is most concerned with right wrist pain/swelling as well as right ring finger pain/swelling.  PMH significant for obesity, HTN, and depression.  Past Medical History:  Diagnosis Date   Abnormal cervical Papanicolaou smear    Anxiety    Depression    Essential hypertension, benign 03/16/2016   Heart murmur    Pt states has a murmur-a common type that does not require treatment.   Hypothyroidism 02/26/2014   Resolved after birth of son 9 years ago.     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 Authorizing Provider  celecoxib (CELEBREX) 200 MG capsule Take 1 capsule (200 mg total) by mouth daily for 15 days. 10/16/23 10/31/23 Yes Trevor Iha, FNP  predniSONE (STERAPRED UNI-PAK 21 TAB) 10 MG (21) TBPK tablet Take by mouth daily. Take 6 tabs by mouth daily  for 2 days, then 5 tabs for 2 days, then 4 tabs for 2 days, then 3 tabs for 2 days, 2 tabs for 2 days, then 1 tab by mouth daily for 2 days 10/16/23  Yes Trevor Iha, FNP  albuterol (VENTOLIN HFA) 108 (90 Base) MCG/ACT inhaler INHALE 2 PUFFS BY MOUTH EVERY 6 HOURS AS NEEDED 12/27/22   Breeback, Jade L, PA-C  AMBULATORY NON FORMULARY MEDICATION Semaglutide 2.5mg  with pyridoxine 10mg  per mL  Inject 10 units subcutaneously once weekly for 4 weeks then increase to next dose 09/08/23   Breeback, Jade L, PA-C  AMBULATORY NON FORMULARY MEDICATION Semaglutide 2.5mg  with pyridoxine 10mg  per mL  Inject 20 units subcutaneously once weekly for 4 weeks then increase to next dose 10/08/23   Breeback, Jade L, PA-C  AMBULATORY NON FORMULARY MEDICATION Semaglutide 2.5mg  with pyridoxine  10mg  per mL  Inject 40 units subcutaneously once weekly for 4 weeks then increase to next dose 11/07/23   Breeback, Jade L, PA-C  AMBULATORY NON FORMULARY MEDICATION Semaglutide 2.5mg  with pyridoxine 10mg  per mL  Inject 96 units subcutaneously once weekly 10/13/23   Breeback, Jade L, PA-C  buPROPion (WELLBUTRIN XL) 150 MG 24 hr tablet Take 1 tablet (150 mg total) by mouth daily. 10/26/22   Breeback, Jade L, PA-C  buPROPion (WELLBUTRIN XL) 300 MG 24 hr tablet Take 300 mg by mouth every morning. 08/19/23   [provider]  fluticasone (FLONASE) 50 MCG/ACT nasal spray SPRAY 2 SPRAYS INTO EACH NOSTRIL EVERY DAY 08/31/23   Breeback, Jade L, PA-C  losartan-hydrochlorothiazide (HYZAAR) 100-25 MG tablet TAKE 1 TABLET BY MOUTH EVERY DAY 06/26/23   Breeback, Jade L, PA-C  Multiple Vitamin (MULTIVITAMIN) tablet Take 1 tablet by mouth daily.    [provider]   sertraline (ZOLOFT) 50 MG tablet Take 50 mg by mouth daily. 08/19/23   [provider]    Family History Family History  Problem Relation Age of Onset   Hypertension Mother    High Cholesterol Mother    Diabetes Mother    Hypertension Father    High Cholesterol Father    Diabetes Father    Thyroid cancer Father    Hypertension Brother    Diabetes Brother    Healthy Brother    Healthy Brother    Alzheimer's disease Paternal Grandfather    Prostate cancer Paternal Grandfather    Colon cancer Neg Hx    Colon polyps Neg Hx    Esophageal cancer Neg Hx    Rectal cancer Neg Hx    Stomach cancer Neg Hx     Social History Social History   Tobacco Use   Smoking status: Former    Types: Cigarettes   Smokeless tobacco: Never  Vaping Use   Vaping status: Never Used  Substance Use Topics   Alcohol use: Yes    Comment: occassionally   Drug use: Never     Allergies   Betadine [povidone iodine], Doxycycline, Sulfonamide derivatives, and Sulfa antibiotics   Review of Systems Review of Systems  Musculoskeletal:        Fall while running yesterday with pain of her right hand     Physical Exam Triage Vital Signs ED Triage Vitals  Encounter Vitals Group     BP      Systolic BP Percentile      Diastolic BP Percentile      Pulse      Resp      Temp      Temp src      SpO2      Weight      Height      Head Circumference      Peak Flow      Pain Score      Pain Loc      Pain Education      Exclude from Growth Chart    No data found.  Updated Vital Signs BP 134/88   Pulse 71   Temp 97.9 F (36.6 C)   Resp 16   SpO2 98%    Physical Exam Vitals and nursing note reviewed.  Constitutional:      Appearance: Normal appearance. She is obese.  HENT:     Head: Normocephalic and atraumatic.     Mouth/Throat:     Mouth: Mucous membranes are moist.     Pharynx:  Oropharynx is clear.  Eyes:     Extraocular Movements: Extraocular movements intact.      Conjunctiva/sclera: Conjunctivae normal.     Pupils: Pupils are equal, round, and reactive to light.  Cardiovascular:     Rate and Rhythm: Normal rate and regular rhythm.     Pulses: Normal pulses.     Heart sounds: Normal heart sounds.  Pulmonary:     Effort: Pulmonary effort is normal.     Breath sounds: Normal breath sounds. No wheezing, rhonchi or rales.  Musculoskeletal:        General: Normal range of motion.     Cervical back: Normal range of motion and neck supple.     Comments: Right wrist (dorsum): TTP, limited range of motion with flexion/extension, pronation/supination  Right ring finger (dorsum/volar aspects): Significant soft tissue swelling noted over superior proximal phalanx, limited range of motion  Skin:    General: Skin is warm and dry.  Neurological:     General: No focal deficit present.     Mental Status: She is alert and oriented to person, place, and time. Mental status is at baseline.      UC Treatments / Results  Labs (all labs ordered are listed, but only abnormal results are displayed) Labs Reviewed - No data to display  EKG   Radiology DG Finger Ring Right Result Date: 10/16/2023 CLINICAL DATA:  Ring finger pain after injury. EXAM: RIGHT RING FINGER 2+V COMPARISON:  None Available. FINDINGS: There is no evidence of fracture or dislocation. Tiny chronic corticated density at the radial aspect of the base of the proximal phalanx. Minor degenerative spurring of the interphalangeal joint. Soft tissues are unremarkable. IMPRESSION: No acute fracture or subluxation of the ring finger. Electronically Signed   By: Narda Rutherford M.D.   On: 10/16/2023 09:36   DG Wrist Complete Right Result Date: 10/16/2023 CLINICAL DATA:  Wrist pain after injury. EXAM: RIGHT WRIST - COMPLETE 3+ VIEW COMPARISON:  None Available. FINDINGS: There is no evidence of fracture or dislocation. Slight ulna positive variance. There is no evidence of arthropathy or other focal bone  abnormality. Soft tissues are unremarkable. IMPRESSION: 1. No fracture or subluxation of the right wrist. 2. Slight ulna positive variance. Electronically Signed   By: Narda Rutherford M.D.   On: 10/16/2023 09:35    Procedures Procedures (including critical care time)  Medications Ordered in UC Medications - No data to display  Initial Impression / Assessment and Plan / UC Course  I have reviewed the triage vital signs and the nursing notes.  Pertinent labs & imaging results that were available during my care of the patient were reviewed by me and considered in my medical decision making (see chart for details).     MDM: 1.  Injury of right wrist, initial encounter-right wrist x-ray results revealed above, Rx'd Celebrex 200 mg capsule: Take 1 capsule daily x 15 days; 2.  Right wrist sprain, initial encounter-right wrist brace placed on right wrist prior to discharge, Rx'd Sterapred Unipak 21 tab 10 mg; 3.  Fall, initial encounter-x-ray of right ring finger negative today patient advised. Advised patient of right wrist and right ring finger x-rays with hardcopy of final results provided.  Advised patient to RICE affected areas of right wrist and right ring finger for 30 minutes 3 times daily.  Advised patient to take medications as directed with food to completion.  Encouraged increase daily water intake to 64 ounces per day while taking these medications.  Advised  patient to wear right wrist brace 24/7 for the next 7 to 10 days.  Advised if symptoms worsen and/or unresolved please follow-up with [facility] for further evaluation.  Patient discharged home, hemodynamically stable. Final Clinical Impressions(s) / UC Diagnoses   Final diagnoses:  Fall, initial encounter  Injury of right wrist, initial encounter  Right wrist sprain, initial encounter     Discharge Instructions      Advised patient of right wrist and right ring finger x-rays with hardcopy of final results  provided.  Advised patient to RICE affected areas of right wrist and right ring finger for 30 minutes 3 times daily.  Advised patient to take medications as directed with food to completion.  Encouraged increase daily water intake to 64 ounces per day while taking these medications.  Advised patient to wear right wrist brace 24/7 for the next 7 to 10 days.  Advised if symptoms worsen and/or unresolved please follow-up with [facility] for further evaluation.     ED Prescriptions     Medication Sig Dispense Auth. Provider   celecoxib (CELEBREX) 200 MG capsule Take 1 capsule (200 mg total) by mouth daily for 15 days. 15 capsule Trevor Iha, FNP   predniSONE (STERAPRED UNI-PAK 21 TAB) 10 MG (21) TBPK tablet Take by mouth daily. Take 6 tabs by mouth daily  for 2 days, then 5 tabs for 2 days, then 4 tabs for 2 days, then 3 tabs for 2 days, 2 tabs for 2 days, then 1 tab by mouth daily for 2 days 42 tablet Trevor Iha, FNP      PDMP not reviewed this encounter.   Trevor Iha, FNP 10/16/23 1002

## 2023-10-16 NOTE — Discharge Instructions (Addendum)
 Advised patient of right wrist and right ring finger x-rays with hardcopy of final results provided.  Advised patient to RICE affected areas of right wrist and right ring finger for 30 minutes 3 times daily.  Advised patient to take medications as directed with food to completion.  Encouraged increase daily water intake to 64 ounces per day while taking these medications.  Advised patient to wear right wrist brace 24/7 for the next 7 to 10 days.  Advised if symptoms worsen and/or unresolved please follow-up with [facility] for further evaluation.

## 2023-10-16 NOTE — ED Triage Notes (Addendum)
 Pt presents to uc with co of fall last night when she was running and she presents to uc with co of right wrist pain and right ring finger pain following the fall. Pt has been using ice for treatment.

## 2023-12-18 ENCOUNTER — Ambulatory Visit: Admitting: Physician Assistant

## 2023-12-18 ENCOUNTER — Encounter: Payer: Self-pay | Admitting: Physician Assistant

## 2023-12-18 VITALS — BP 136/86 | HR 96 | Temp 98.1°F | Ht 62.0 in | Wt 181.0 lb

## 2023-12-18 DIAGNOSIS — R058 Other specified cough: Secondary | ICD-10-CM

## 2023-12-18 MED ORDER — BENZONATATE 200 MG PO CAPS: 200.0000 mg | ORAL_CAPSULE | Freq: Three times a day (TID) | ORAL | 0 refills | Status: DC | PRN

## 2023-12-18 MED ORDER — METHYLPREDNISOLONE 4 MG PO TBPK
ORAL_TABLET | ORAL | 0 refills | Status: DC
Start: 2023-12-18 — End: 2024-02-19

## 2023-12-18 MED ORDER — HYDROCOD POLI-CHLORPHE POLI ER 10-8 MG/5ML PO SUER: 5.0000 mL | Freq: Two times a day (BID) | ORAL | 0 refills | Status: DC | PRN

## 2023-12-18 NOTE — Progress Notes (Signed)
   Established Patient Office Visit  Subjective   Patient ID: Julie Martin, female    DOB: March 31, 1971  Age: 53 y.o. MRN: [id number]  Chief Complaint  Patient presents with   Cough    Onset 8/6 weeks, med consult, nausea     HPI Pt is a 53 yo female who had URI about 6 weeks ago and feels better except for cough. This seems to happen regularly but usually tessalon  pearls and a few weeks and resolves. This time 6 weeks later and still coughing regularly.  Cough is dry and not productive. Pt denies any fever, chills, body aches, SOB.   ROS See HPI.    Objective:     BP 136/86   Pulse 96   Temp 98.1 F (36.7 C) (Oral)   Ht 5\' 2"  (1.575 m)   Wt 181 lb (82.1 kg)   SpO2 99%   BMI 33.11 kg/m  BP Readings from Last 3 Encounters:  12/18/23 136/86  10/16/23 134/88  04/24/23 128/86   Wt Readings from Last 3 Encounters:  12/18/23 181 lb (82.1 kg)  09/08/23 196 lb (88.9 kg)  04/24/23 197 lb (89.4 kg)      Physical Exam Constitutional:      Appearance: Normal appearance. She is obese.  HENT:     Head: Normocephalic.     Right Ear: Tympanic membrane, ear canal and external ear normal. There is no impacted cerumen.     Left Ear: Tympanic membrane, ear canal and external ear normal. There is no impacted cerumen.     Mouth/Throat:     Mouth: Mucous membranes are moist.     Pharynx: No oropharyngeal exudate or posterior oropharyngeal erythema.  Eyes:     Conjunctiva/sclera: Conjunctivae normal.     Pupils: Pupils are equal, round, and reactive to light.  Cardiovascular:     Rate and Rhythm: Normal rate and regular rhythm.  Pulmonary:     Effort: Pulmonary effort is normal.     Breath sounds: Normal breath sounds.  Musculoskeletal:     Cervical back: Normal range of motion and neck supple. No tenderness.     Right lower leg: No edema.     Left lower leg: No edema.  Lymphadenopathy:     Cervical: No cervical adenopathy.  Neurological:     General: No focal deficit  present.     Mental Status: She is alert and oriented to person, place, and time.  Psychiatric:        Mood and Affect: Mood normal.      The 10-year ASCVD risk score (Arnett DK, et al., 2019) is: 1.8%    Assessment & Plan:  Aaron AasAaron AasDebra "[name]" was seen today for cough.  Diagnoses and all orders for this visit:  Post-viral cough syndrome -     chlorpheniramine-HYDROcodone  (TUSSIONEX) 10-8 MG/5ML; Take 5 mLs by mouth every 12 (twelve) hours as needed. -     benzonatate  (TESSALON ) 200 MG capsule; Take 1 capsule (200 mg total) by mouth 3 (three) times daily as needed. -     methylPREDNISolone  (MEDROL  DOSEPAK) 4 MG TBPK tablet; Take as directed by package insert.   Last time treated in office for post viral cough was 2023.  Reviewed medication list and not on ACE.  Start medrol  dose pack, tessalon  pearls, tussionex.  Encouraged to use cough drops and stay hydrated.  Follow up if no improvement or cough persist.   Sandy Crumb, PA-C

## 2023-12-19 ENCOUNTER — Encounter: Payer: Self-pay | Admitting: Physician Assistant

## 2023-12-26 ENCOUNTER — Other Ambulatory Visit: Payer: Self-pay | Admitting: Physician Assistant

## 2023-12-26 DIAGNOSIS — I10 Essential (primary) hypertension: Secondary | ICD-10-CM

## 2024-02-19 ENCOUNTER — Telehealth (INDEPENDENT_AMBULATORY_CARE_PROVIDER_SITE_OTHER): Admitting: Physician Assistant

## 2024-02-19 VITALS — Wt 177.0 lb

## 2024-02-19 DIAGNOSIS — R110 Nausea: Secondary | ICD-10-CM

## 2024-02-19 DIAGNOSIS — E66811 Obesity, class 1: Secondary | ICD-10-CM | POA: Diagnosis not present

## 2024-02-19 DIAGNOSIS — Z6832 Body mass index (BMI) 32.0-32.9, adult: Secondary | ICD-10-CM | POA: Diagnosis not present

## 2024-02-19 DIAGNOSIS — E6609 Other obesity due to excess calories: Secondary | ICD-10-CM

## 2024-02-19 DIAGNOSIS — Z7689 Persons encountering health services in other specified circumstances: Secondary | ICD-10-CM

## 2024-02-19 MED ORDER — TIRZEPATIDE 10 MG/0.5ML ~~LOC~~ SOAJ: SUBCUTANEOUS | 2 refills | Status: DC

## 2024-02-19 MED ORDER — ONDANSETRON 8 MG PO TBDP: 8.0000 mg | ORAL_TABLET | Freq: Three times a day (TID) | ORAL | 1 refills | Status: AC | PRN

## 2024-02-19 NOTE — Patient Instructions (Signed)
 Tirzepatide Injection (Weight Management) What is this medication? TIRZEPATIDE (tir ZEP a tide) promotes weight loss. It may also be used to maintain weight loss.  It works by decreasing appetite. It can be used to treat sleep apnea. Changes to diet and exercise are often combined with this medication. This medicine may be used for other purposes; ask your health care provider or pharmacist if you have questions. COMMON BRAND NAME(S): Zepbound What should I tell my care team before I take this medication? They need to know if you have any of these conditions: Diabetes Eye disease caused by diabetes Gallbladder disease Have or have had depression Have or have had pancreatitis Having surgery Kidney disease Personal or family history of MEN 2, a condition that causes endocrine gland tumors Personal or family history of thyroid  cancer Stomach or intestine problems, such as problems digesting food Suicidal thoughts, plans, or attempt An unusual or allergic reaction to tirzepatide, other medications, foods, dyes, or preservatives Pregnant or trying to get pregnant Breastfeeding How should I use this medication? This medication is injected under the skin. You will be taught how to prepare and give it. Take it as directed on the prescription label. Keep taking it unless your care team tells you to stop. It is important that you put your used needles and syringes in a special sharps container. Do not put them in a trash can. If you do not have a sharps container, call your pharmacist or care team to get one. A special MedGuide will be given to you by the pharmacist with each prescription and refill. Be sure to read this information carefully each time. This medication comes with INSTRUCTIONS FOR USE. Ask your pharmacist for directions on how to use this medication. Read the information carefully. Talk to your pharmacist or care team if you have questions. Talk to your care team about the use of this  medication in children. Special care may be needed. Overdosage: If you think you have taken too much of this medicine contact a poison control center or emergency room at once. NOTE: This medicine is only for you. Do not share this medicine with others. What if I miss a dose? If you miss a dose, take it as soon as you can unless it is more than 4 days (96 hours) late. If it is more than 4 days late, skip the missed dose. Take the next dose at the normal time. Do not take 2 doses within 3 days (72 hours) of each other. What may interact with this medication? Certain medications for diabetes, such as insulin , glyburide, glipizide This medication may affect how other medications work. Talk with your care team about all of the medications you take. They may suggest changes to your treatment plan to lower the risk of side effects and to make sure your medications work as intended. This list may not describe all possible interactions. Give your health care provider a list of all the medicines, herbs, non-prescription drugs, or dietary supplements you use. Also tell them if you smoke, drink alcohol, or use illegal drugs. Some items may interact with your medicine. What should I watch for while using this medication? Visit your care team for regular checks on your progress. Tell your care team if your condition does not start to get better or if it gets worse. Tell your care team if you are taking medication to treat diabetes, such as insulin  or glipizide. This may increase your risk of low blood sugar. Know the  symptoms of low blood sugar and how to treat it. Talk to your care team about your risk of cancer. You may be more at risk for certain types of cancer if you take this medication. Talk to your care team right away if you have a lump or swelling in your neck, hoarseness that does not go away, trouble swallowing, shortness of breath, or trouble breathing. Make sure you stay hydrated while taking this  medication. Drink water often. Eat fruits and veggies that have a high water content. Drink more water when it is hot or you are active. Talk to your care team right away if you have fever, infection, vomiting, diarrhea, or if you sweat a lot while taking this medication. The loss of too much body fluid may make it dangerous for you to take this medication. If you are going to need surgery or a procedure, tell your care team that you are taking this medication. Estrogen and progestin hormones that you take by mouth may not work as well while you are taking this medication. Switch to a non-oral contraceptive or add a barrier contraceptive for 4 weeks after starting this medication and after each dose increase. Talk to your care team about contraceptive options. They can help you find the option that works for you. Do not take this medication without first talking to your care team if you may be or could become pregnant. Your care team can help you find the option that works for you. Weight loss is not recommended during pregnancy. Talk to your care team if you are breastfeeding. When recommended, this medication may be taken. Its use during breastfeeding has not been well studied. Your care team may suggest other options. What side effects may I notice from receiving this medication? Side effects that you should report to your care team as soon as possible: Allergic reactions--skin rash, itching, hives, swelling of the face, lips, tongue, or throat Change in vision Dehydration--increased thirst, dry mouth, feeling faint or lightheaded, headache, dark yellow or brown urine Fast or irregular heartbeat Gallbladder problems--severe stomach pain, nausea, vomiting, fever Kidney injury--decrease in the amount of urine, swelling of the ankles, hands, or feet Pancreatitis--severe stomach pain that spreads to your back or gets worse after eating or when touched, fever, nausea, vomiting Thoughts of suicide or  self-harm, worsening mood, feelings of depression Thyroid  cancer--new mass or lump in the neck, pain or trouble swallowing, trouble breathing, hoarseness Side effects that usually do not require medical attention (report these to your care team if they continue or are bothersome): Constipation Diarrhea Loss of appetite Nausea Upset stomach This list may not describe all possible side effects. Call your doctor for medical advice about side effects. You may report side effects to FDA at 1-800-FDA-1088. Where should I keep my medication? Keep out of the reach of children and pets. Store in a refrigerator or at room temperature up to 30 degrees C (86 degrees F). Keep it in the original container. Protect from light. Refrigeration (preferred): Store in the refrigerator. Do not freeze. Get rid of any unused medication after the expiration date. Room temperature: This medication may be stored at room temperature for up to 21 days. If it is stored at room temperature, get rid of any unused medication after 21 days or after it expires, whichever is first. To get rid of medications that are no longer needed or have expired: Take the medication to a medication take-back program. Check with your pharmacy or law enforcement  to find a location. If you cannot return the medication, ask your pharmacist or care team how to get rid of this medication safely. NOTE: This sheet is a summary. It may not cover all possible information. If you have questions about this medicine, talk to your doctor, pharmacist, or health care provider.  2025 Elsevier/Gold Standard (2023-08-15 00:00:00)

## 2024-02-19 NOTE — Progress Notes (Unsigned)
.  Virtual Visit via Video Note  I connected with Adrien LITTIE Mealing on 02/20/24 at  4:20 PM EDT by a video enabled telemedicine application and verified that I am speaking with the correct person using two identifiers.  Location: Patient: home Provider: clinic  .SABRAParticipating in visit:  Patient: [name] Provider:Emmelina Mcloughlin PA-C   I discussed the limitations of evaluation and management by telemedicine and the availability of in person appointments. The patient expressed understanding and agreed to proceed.  History of Present Illness: Pt is a 53 yo obese female who calls into the clinic to discuss weight management and refills. She has been using compounded wegovy and lost 30lbs with medication, diet and exercise since January 2025. She is only up to 40 units of compounded wegovy which is equivalent to about 1mg  of wegovy. She needs to switch to liposlim to continue therapy. She would like to get her BMI under 27. She is tolerating medication well except for some nausea on first day of injection and next.   Observations/Objective: No acute distress Normal breathing Normal mood and appearance  .SABRA Today's Vitals   02/19/24 1618  Weight: 177 lb (80.3 kg)   Body mass index is 32.37 kg/m.    Assessment and Plan: SABRASABRASanora [name] was seen today for medical management of chronic issues.  Diagnoses and all orders for this visit:  Encounter for weight management -     tirzepatide  (MOUNJARO ) 10 MG/0.5ML Pen; Liposlim.  Tirzepatide /Pyridoxine/Thiamine/L-Carnitine 10mg /mL.  Inject 2.5 mg/25 units subcu weekly for 2 weeks then 5 mg/50 units subcu weekly for 2 weeks then 7.5 mg/75 units subcu weekly for 8 weeks.  Class 1 obesity due to excess calories without serious comorbidity with body mass index (BMI) of 32.0 to 32.9 in adult -     tirzepatide  (MOUNJARO ) 10 MG/0.5ML Pen; Liposlim.  Tirzepatide /Pyridoxine/Thiamine/L-Carnitine 10mg /mL.  Inject 2.5 mg/25 units subcu weekly for 2 weeks then 5  mg/50 units subcu weekly for 2 weeks then 7.5 mg/75 units subcu weekly for 8 weeks.  Nausea -     ondansetron  (ZOFRAN -ODT) 8 MG disintegrating tablet; Take 1 tablet (8 mg total) by mouth every 8 (eight) hours as needed for nausea.  Stop semaglutide and start liposlim 25 units weekly for 2 weeks, 50 units weekly for 2 weeks then 75 units for 8 weeks.  [facility] to [facility].  Zofran  as needed Make sure getting at least 80g of protein with regular 150 minutes of exercise.  Follow up in 3 months    Follow Up Instructions:    I discussed the assessment and treatment plan with the patient. The patient was provided an opportunity to ask questions and all were answered. The patient agreed with the plan and demonstrated an understanding of the instructions.   The patient was advised to call back or seek an in-person evaluation if the symptoms worsen or if the condition fails to improve as anticipated.    Amarisa Wilinski, PA-C

## 2024-02-20 ENCOUNTER — Encounter: Payer: Self-pay | Admitting: Physician Assistant

## 2024-02-20 ENCOUNTER — Telehealth: Payer: Self-pay

## 2024-02-20 ENCOUNTER — Other Ambulatory Visit (HOSPITAL_COMMUNITY): Payer: Self-pay

## 2024-02-20 DIAGNOSIS — Z7689 Persons encountering health services in other specified circumstances: Secondary | ICD-10-CM | POA: Insufficient documentation

## 2024-02-20 NOTE — Telephone Encounter (Signed)
 Pharmacy Patient Advocate Encounter   Received notification from CoverMyMeds that prior authorization for Mounjaro  10MG /0.5ML auto-injectors is required/requested.   Insurance verification completed.   The patient is insured through [facility] .    Ozempic/Mounjaro  is approved exclusively as an adjunct to diet and exercise to improve glycemic  control in adults with type 2 diabetes mellitus. A review of patient's medical chart reveals no  documented diagnosis of type 2 diabetes or an A1C indicative of diabetes. Therefore, they do not  currently meet the criteria for prior authorization of this medication. If clinically appropriate, alternative  options such as Saxenda , Zepbound , or Georjean may be considered for this patient.

## 2024-02-26 ENCOUNTER — Telehealth: Payer: Self-pay | Admitting: *Deleted

## 2024-02-26 NOTE — Telephone Encounter (Signed)
 Mychart message also sent by pt about medication. Closing this encounter.

## 2024-02-26 NOTE — Telephone Encounter (Signed)
 Copied from CRM [id number]. Topic: Clinical - Prescription Issue >> Feb 26, 2024 11:11 AM Miquel SAILOR wrote: Reason for CRM: tirzepatide  (MOUNJARO ) 10 MG/0.5ML Pen-Patient stated needs call back on medication missing some demographics for medication delay of refill. Almost ou(taking different medication) [phone]  Location:  [facility] - [place], [place] - [address] [place] [address] [place] [address] Phone: [phone] Fax: [phone] Hours: Not open 24 hours

## 2024-02-27 NOTE — Telephone Encounter (Signed)
 Called [facility] to confirm faxed was received with pt demographic and updated script, spoke with lindsay who confirmed information was received and they will process the script and contact the pt when ready.  Called pt to notify, pt was agreeable and had no additional questions at the time of call.  Resolved no further action needed.

## 2024-02-27 NOTE — Telephone Encounter (Signed)
 Re faxed new script to [facility],along with pt demographics, confirmation fax  sent 02/27/24 9:27am

## 2024-05-13 ENCOUNTER — Other Ambulatory Visit (HOSPITAL_BASED_OUTPATIENT_CLINIC_OR_DEPARTMENT_OTHER): Payer: Self-pay | Admitting: Physician Assistant

## 2024-05-13 DIAGNOSIS — Z1231 Encounter for screening mammogram for malignant neoplasm of breast: Secondary | ICD-10-CM

## 2024-05-17 ENCOUNTER — Encounter

## 2024-05-17 DIAGNOSIS — Z1231 Encounter for screening mammogram for malignant neoplasm of breast: Secondary | ICD-10-CM

## 2024-06-24 ENCOUNTER — Telehealth: Payer: Self-pay

## 2024-06-24 NOTE — Telephone Encounter (Signed)
 Copied from CRM #[id number]. Topic: Clinical - Prescription Issue >> Jun 24, 2024 12:37 PM Cleave MATSU wrote: Reason for CRM: pt stated that [facility] requested for tirzepatide  to be approved and sent in to them can you follow up with pt to assist.

## 2024-06-25 NOTE — Telephone Encounter (Signed)
 Called [facility] as prescription in chart shows written as 90 day (9ml) supply with 2 refills  Pharmacy tech states the prescription was entered as 30 day(23ml)  supply with 2 refills  She will pull  hard copy script to see if this was entered in error and will call us  back.

## 2024-06-25 NOTE — Telephone Encounter (Signed)
 Forwarding message to Dr. Alvan covering [place], [place]  Ebony with [facility] called us  back.  She state the original prescription for Liposlim  shown only 3ml with 2 refills so refill is now needed   Requesting rx rf of Liposlim  Last written 02/19/2024 Last OV 02/19/2024 Upcoming appt = none

## 2024-06-25 NOTE — Telephone Encounter (Signed)
 OK for 1 more refill.  She has not been seen since July and these medications do have to be monitored so she really needs to make an appointment in the next 30 days

## 2024-06-26 ENCOUNTER — Other Ambulatory Visit: Payer: Self-pay

## 2024-06-26 DIAGNOSIS — Z7689 Persons encountering health services in other specified circumstances: Secondary | ICD-10-CM

## 2024-06-26 DIAGNOSIS — E66811 Obesity, class 1: Secondary | ICD-10-CM

## 2024-06-26 MED ORDER — TIRZEPATIDE 10 MG/0.5ML ~~LOC~~ SOAJ: SUBCUTANEOUS | 0 refills | Status: DC

## 2024-06-26 MED ORDER — TIRZEPATIDE 10 MG/0.5ML ~~LOC~~ SOAJ
SUBCUTANEOUS | 2 refills | Status: DC
Start: 2024-06-26 — End: 2024-06-26

## 2024-06-26 NOTE — Telephone Encounter (Signed)
 Patient informed and appt scheduled for 07/15/2024 for weight management follow up .

## 2024-06-26 NOTE — Telephone Encounter (Signed)
 Faxed 30 days supply of Liposlim to [facility] [place] .

## 2024-07-15 ENCOUNTER — Encounter: Payer: Self-pay | Admitting: Physician Assistant

## 2024-07-15 ENCOUNTER — Ambulatory Visit: Admitting: Physician Assistant

## 2024-07-15 VITALS — BP 132/80 | HR 64 | Ht 62.0 in | Wt 165.0 lb

## 2024-07-15 DIAGNOSIS — Z6832 Body mass index (BMI) 32.0-32.9, adult: Secondary | ICD-10-CM

## 2024-07-15 DIAGNOSIS — Z13220 Encounter for screening for lipoid disorders: Secondary | ICD-10-CM

## 2024-07-15 DIAGNOSIS — I10 Essential (primary) hypertension: Secondary | ICD-10-CM

## 2024-07-15 DIAGNOSIS — E66811 Obesity, class 1: Secondary | ICD-10-CM

## 2024-07-15 DIAGNOSIS — R42 Dizziness and giddiness: Secondary | ICD-10-CM | POA: Insufficient documentation

## 2024-07-15 DIAGNOSIS — Z6830 Body mass index (BMI) 30.0-30.9, adult: Secondary | ICD-10-CM

## 2024-07-15 DIAGNOSIS — E6609 Other obesity due to excess calories: Secondary | ICD-10-CM

## 2024-07-15 DIAGNOSIS — Z7689 Persons encountering health services in other specified circumstances: Secondary | ICD-10-CM

## 2024-07-15 MED ORDER — TIRZEPATIDE 10 MG/0.5ML ~~LOC~~ SOAJ: SUBCUTANEOUS | 1 refills | Status: DC

## 2024-07-15 MED ORDER — BENZONATATE 200 MG PO CAPS: 200.0000 mg | ORAL_CAPSULE | Freq: Three times a day (TID) | ORAL | 0 refills | Status: AC | PRN

## 2024-07-15 MED ORDER — LOSARTAN POTASSIUM-HCTZ 100-25 MG PO TABS: 1.0000 | ORAL_TABLET | Freq: Every day | ORAL | 1 refills | Status: AC

## 2024-07-15 MED ORDER — TIRZEPATIDE 10 MG/0.5ML ~~LOC~~ SOAJ: SUBCUTANEOUS | 1 refills | Status: AC

## 2024-07-15 NOTE — Patient Instructions (Signed)
 Orthostatic Hypotension Blood pressure is a measurement of how strongly, or weakly, your circulating blood is pressing against the walls of your arteries. Orthostatic hypotension is a drop in blood pressure that can happen when you change positions, such as when you go from lying down to standing. Arteries are blood vessels that carry blood from your heart throughout your body. When blood pressure is too low, you may not get enough blood to your brain or to the rest of your organs. Orthostatic hypotension can cause light-headedness, sweating, rapid heartbeat, blurred vision, and fainting. These symptoms require further investigation into the cause. What are the causes? Orthostatic hypotension can be caused by many things, including: Sudden changes in posture, such as standing up quickly after you have been sitting or lying down. Loss of blood (anemia) or loss of body fluids (dehydration). Heart problems, neurologic problems, or hormone problems. Pregnancy. Aging. The risk for this condition increases as you get older. Severe infection (sepsis). Certain medicines, such as medicines for high blood pressure or medicines that make the body lose excess fluids (diuretics). What are the signs or symptoms? Symptoms of this condition may include: Weakness, light-headedness, or dizziness. Sweating. Blurred vision. Tiredness (fatigue). Rapid heartbeat. Fainting, in severe cases. How is this diagnosed? This condition is diagnosed based on: Your symptoms and medical history. Your blood pressure measurements. Your health care provider will check your blood pressure when you are: Lying down. Sitting. Standing. A blood pressure reading is recorded as two numbers, such as "120 over 80" (or 120/80). The first ("top") number is called the systolic pressure. It is a measure of the pressure in your arteries as your heart beats. The second ("bottom") number is called the diastolic pressure. It is a measure of  the pressure in your arteries when your heart relaxes between beats. Blood pressure is measured in a unit called mmHg. Healthy blood pressure for most adults is 120/80 mmHg. Orthostatic hypotension is defined as a 20 mmHg drop in systolic pressure or a 10 mmHg drop in diastolic pressure within 3 minutes of standing. Other information or tests that may be used to diagnose orthostatic hypotension include: Your other vital signs, such as your heart rate and temperature. Blood tests. An electrocardiogram (ECG) or echocardiogram. A Holter monitor. This is a device you wear that records your heart rhythm continuously, usually for 24-48 hours. Tilt table test. For this test, you will be safely secured to a table that moves you from a lying position to an upright position. Your heart rhythm and blood pressure will be monitored during the test. How is this treated? This condition may be treated by: Changing your diet. This may involve eating more salt (sodium) or drinking more water. Changing the dosage of certain medicines you are taking that might be lowering your blood pressure. Correcting the underlying reason for the orthostatic hypotension. Wearing compression stockings. Taking medicines to raise your blood pressure. Avoiding actions that trigger symptoms. Follow these instructions at home: Medicines Take over-the-counter and prescription medicines only as told by your health care provider. Follow instructions from your health care provider about changing the dosage of your current medicines, if this applies. Do not stop or adjust any of your medicines on your own. Eating and drinking  Drink enough fluid to keep your urine pale yellow. Eat extra salt only as directed. Do not add extra salt to your diet unless advised by your health care provider. Eat frequent, small meals. Avoid standing up suddenly after eating. General instructions  Get up slowly from lying down or sitting positions. This  gives your blood pressure a chance to adjust. Avoid hot showers and excessive heat as directed by your health care provider. Engage in regular physical activity as directed by your health care provider. If you have compression stockings, wear them as told. Keep all follow-up visits. This is important. Contact a health care provider if: You have a fever for more than 2-3 days. You feel more thirsty than usual. You feel dizzy or weak. Get help right away if: You have chest pain. You have a fast or irregular heartbeat. You become sweaty or feel light-headed. You feel short of breath. You faint. You have any symptoms of a stroke. "BE FAST" is an easy way to remember the main warning signs of a stroke: B - Balance. Signs are dizziness, sudden trouble walking, or loss of balance. E - Eyes. Signs are trouble seeing or a sudden change in vision. F - Face. Signs are sudden weakness or numbness of the face, or the face or eyelid drooping on one side. A - Arms. Signs are weakness or numbness in an arm. This happens suddenly and usually on one side of the body. S - Speech. Signs are sudden trouble speaking, slurred speech, or trouble understanding what people say. T - Time. Time to call emergency services. Write down what time symptoms started. You have other signs of a stroke, such as: A sudden, severe headache with no known cause. Nausea or vomiting. Seizure. These symptoms may represent a serious problem that is an emergency. Do not wait to see if the symptoms will go away. Get medical help right away. Call your local emergency services (911 in the [place] Do not drive yourself to the hospital. Summary Orthostatic hypotension is a sudden drop in blood pressure. It can cause light-headedness, sweating, rapid heartbeat, blurred vision, and fainting. Orthostatic hypotension can be diagnosed by having your blood pressure taken while lying down, sitting, and then standing. Treatment may involve  changing your diet, wearing compression stockings, sitting up slowly, adjusting your medicines, or correcting the underlying reason for the orthostatic hypotension. Get help right away if you have chest pain, a fast or irregular heartbeat, or symptoms of a stroke. This information is not intended to replace advice given to you by your health care provider. Make sure you discuss any questions you have with your health care provider. Document Revised: 10/01/2020 Document Reviewed: 10/01/2020 Elsevier Patient Education  2024 [employer].

## 2024-07-15 NOTE — Progress Notes (Unsigned)
° °  Established Patient Office Visit  Subjective   Patient ID: Julie Martin, female    DOB: 06-22-1971  Age: 53 y.o. MRN: [id number]  No chief complaint on file.   HPI  {History (Optional):23778}  ROS    Objective:     There were no vitals taken for this visit. {Vitals History (Optional):23777}  Physical Exam   No results found for any visits on 07/15/24.  {Labs (Optional):23779}  The 10-year ASCVD risk score (Arnett DK, et al., 2019) is: 2%    Assessment & Plan:   Problem List Items Addressed This Visit       Unprioritized   Obesity - Primary   Essential hypertension, benign   Encounter for weight management    No follow-ups on file.    Cardarius Senat, PA-C

## 2024-07-16 DIAGNOSIS — R058 Other specified cough: Secondary | ICD-10-CM | POA: Insufficient documentation

## 2024-07-17 ENCOUNTER — Ambulatory Visit: Payer: Self-pay | Admitting: Physician Assistant

## 2024-07-17 LAB — CBC WITH DIFFERENTIAL/PLATELET
Basophils Absolute: 0.1 x10E3/uL (ref 0.0–0.2)
Basos: 1 %
EOS (ABSOLUTE): 0.1 x10E3/uL (ref 0.0–0.4)
Eos: 1 %
Hematocrit: 44.8 % (ref 34.0–46.6)
Hemoglobin: 14.7 g/dL (ref 11.1–15.9)
Immature Grans (Abs): 0 x10E3/uL (ref 0.0–0.1)
Immature Granulocytes: 0 %
Lymphocytes Absolute: 2 x10E3/uL (ref 0.7–3.1)
Lymphs: 28 %
MCH: 29.8 pg (ref 26.6–33.0)
MCHC: 32.8 g/dL (ref 31.5–35.7)
MCV: 91 fL (ref 79–97)
Monocytes Absolute: 0.3 x10E3/uL (ref 0.1–0.9)
Monocytes: 5 %
Neutrophils Absolute: 4.8 x10E3/uL (ref 1.4–7.0)
Neutrophils: 65 %
Platelets: 247 x10E3/uL (ref 150–450)
RBC: 4.94 x10E6/uL (ref 3.77–5.28)
RDW: 12.2 % (ref 11.7–15.4)
WBC: 7.3 x10E3/uL (ref 3.4–10.8)

## 2024-07-17 LAB — CMP14+EGFR
ALT: 11 IU/L (ref 0–32)
AST: 14 IU/L (ref 0–40)
Albumin: 4.3 g/dL (ref 3.8–4.9)
Alkaline Phosphatase: 79 IU/L (ref 49–135)
BUN/Creatinine Ratio: 18 (ref 9–23)
BUN: 16 mg/dL (ref 6–24)
Bilirubin Total: 0.4 mg/dL (ref 0.0–1.2)
CO2: 24 mmol/L (ref 20–29)
Calcium: 9.2 mg/dL (ref 8.7–10.2)
Chloride: 101 mmol/L (ref 96–106)
Creatinine, Ser: 0.87 mg/dL (ref 0.57–1.00)
Globulin, Total: 2.5 g/dL (ref 1.5–4.5)
Glucose: 80 mg/dL (ref 70–99)
Potassium: 4 mmol/L (ref 3.5–5.2)
Sodium: 141 mmol/L (ref 134–144)
Total Protein: 6.8 g/dL (ref 6.0–8.5)
eGFR: 81 mL/min/1.73 (ref >59)

## 2024-07-17 LAB — LIPID PANEL
Chol/HDL Ratio: 3.3 ratio (ref 0.0–4.4)
Cholesterol, Total: 187 mg/dL (ref 100–199)
HDL: 57 mg/dL (ref >39)
LDL Chol Calc (NIH): 116 mg/dL — ABNORMAL HIGH (ref 0–99)
Triglycerides: 73 mg/dL (ref 0–149)
VLDL Cholesterol Cal: 14 mg/dL (ref 5–40)

## 2024-07-17 LAB — VITAMIN D 25 HYDROXY (VIT D DEFICIENCY, FRACTURES): Vit D, 25-Hydroxy: 40.8 ng/mL (ref 30.0–100.0)

## 2024-07-17 LAB — TSH+FREE T4
Free T4: 1.24 ng/dL (ref 0.82–1.77)
TSH: 2.01 u[IU]/mL (ref 0.450–4.500)

## 2024-07-17 NOTE — Progress Notes (Signed)
 Julie Martin,   LDL improved.  HDL, good cholesterol, looks GREAT. Very low 10 year cardiovascular risk.   .The 10-year ASCVD risk score (Arnett DK, et al., 2019) is: 1.7%   Values used to calculate the score:     Age: 53 years     Clinically relevant sex: Female     Is Non-Hispanic African American: No     Diabetic: No     Tobacco smoker: No     Systolic Blood Pressure: 132 mmHg     Is BP treated: Yes     HDL Cholesterol: 57 mg/dL     Total Cholesterol: 187 mg/dL   Thyroid normal.  Hemoglobin normal.  Vitamin D  normal.  Kidney and liver look good.
